# Patient Record
Sex: Female | Born: 1990 | Hispanic: Yes | Marital: Married | Smoking: Never smoker
Health system: Southern US, Community
[De-identification: ages and names within clinical notes are randomized; demographics above are authoritative.]

## PROBLEM LIST (undated history)

## (undated) ENCOUNTER — Inpatient Hospital Stay (HOSPITAL_COMMUNITY): Payer: Self-pay

## (undated) DIAGNOSIS — O021 Missed abortion: Secondary | ICD-10-CM

## (undated) DIAGNOSIS — I10 Essential (primary) hypertension: Secondary | ICD-10-CM

## (undated) DIAGNOSIS — O139 Gestational [pregnancy-induced] hypertension without significant proteinuria, unspecified trimester: Secondary | ICD-10-CM

## (undated) DIAGNOSIS — Z789 Other specified health status: Secondary | ICD-10-CM

## (undated) HISTORY — DX: Essential (primary) hypertension: I10

## (undated) HISTORY — PX: NO PAST SURGERIES: SHX2092

---

## 2014-11-21 ENCOUNTER — Inpatient Hospital Stay (HOSPITAL_COMMUNITY)
Admission: EM | Admit: 2014-11-21 | Discharge: 2014-11-21 | Disposition: A | Discharge status: Home / Self Care | Payer: Self-pay | Source: Ambulatory Visit | Attending: Family Medicine | Admitting: Family Medicine

## 2014-11-21 ENCOUNTER — Inpatient Hospital Stay (HOSPITAL_COMMUNITY): Payer: Self-pay

## 2014-11-21 ENCOUNTER — Encounter (HOSPITAL_COMMUNITY): Payer: Self-pay | Admitting: *Deleted

## 2014-11-21 DIAGNOSIS — B9689 Other specified bacterial agents as the cause of diseases classified elsewhere: Secondary | ICD-10-CM | POA: Insufficient documentation

## 2014-11-21 DIAGNOSIS — R109 Unspecified abdominal pain: Secondary | ICD-10-CM | POA: Insufficient documentation

## 2014-11-21 DIAGNOSIS — O4691 Antepartum hemorrhage, unspecified, first trimester: Secondary | ICD-10-CM

## 2014-11-21 DIAGNOSIS — O209 Hemorrhage in early pregnancy, unspecified: Secondary | ICD-10-CM | POA: Insufficient documentation

## 2014-11-21 DIAGNOSIS — N760 Acute vaginitis: Secondary | ICD-10-CM | POA: Insufficient documentation

## 2014-11-21 DIAGNOSIS — Z3A01 Less than 8 weeks gestation of pregnancy: Secondary | ICD-10-CM | POA: Insufficient documentation

## 2014-11-21 DIAGNOSIS — O360110 Maternal care for anti-D [Rh] antibodies, first trimester, not applicable or unspecified: Secondary | ICD-10-CM

## 2014-11-21 DIAGNOSIS — O23591 Infection of other part of genital tract in pregnancy, first trimester: Secondary | ICD-10-CM | POA: Insufficient documentation

## 2014-11-21 DIAGNOSIS — O26899 Other specified pregnancy related conditions, unspecified trimester: Secondary | ICD-10-CM

## 2014-11-21 DIAGNOSIS — O9989 Other specified diseases and conditions complicating pregnancy, childbirth and the puerperium: Secondary | ICD-10-CM | POA: Insufficient documentation

## 2014-11-21 LAB — URINALYSIS, ROUTINE W REFLEX MICROSCOPIC
Bilirubin Urine: NEGATIVE
Glucose, UA: NEGATIVE mg/dL
Ketones, ur: NEGATIVE mg/dL
LEUKOCYTES UA: NEGATIVE
Nitrite: NEGATIVE
PROTEIN: NEGATIVE mg/dL
Specific Gravity, Urine: 1.025 (ref 1.005–1.030)
Urobilinogen, UA: 0.2 mg/dL (ref 0.0–1.0)
pH: 6 (ref 5.0–8.0)

## 2014-11-21 LAB — ABO/RH: ABO/RH(D): O NEG

## 2014-11-21 LAB — CBC
HEMATOCRIT: 35.8 % — AB (ref 36.0–46.0)
Hemoglobin: 12.5 g/dL (ref 12.0–15.0)
MCH: 29.8 pg (ref 26.0–34.0)
MCHC: 34.9 g/dL (ref 30.0–36.0)
MCV: 85.4 fL (ref 78.0–100.0)
Platelets: 217 10*3/uL (ref 150–400)
RBC: 4.19 MIL/uL (ref 3.87–5.11)
RDW: 13 % (ref 11.5–15.5)
WBC: 10.8 10*3/uL — AB (ref 4.0–10.5)

## 2014-11-21 LAB — POCT PREGNANCY, URINE: Preg Test, Ur: POSITIVE — AB

## 2014-11-21 LAB — HCG, QUANTITATIVE, PREGNANCY: hCG, Beta Chain, Quant, S: 13254 m[IU]/mL — ABNORMAL HIGH (ref <5)

## 2014-11-21 LAB — WET PREP, GENITAL
Trich, Wet Prep: NONE SEEN
Yeast Wet Prep HPF POC: NONE SEEN

## 2014-11-21 LAB — URINE MICROSCOPIC-ADD ON

## 2014-11-21 MED ORDER — RHO D IMMUNE GLOBULIN 1500 UNIT/2ML IJ SOSY
300.0000 ug | PREFILLED_SYRINGE | Freq: Once | INTRAMUSCULAR | Status: AC
  Administered 2014-11-21: 300 ug via INTRAMUSCULAR
  Filled 2014-11-21: qty 2

## 2014-11-21 MED ORDER — METRONIDAZOLE 500 MG PO TABS: 500.0000 mg | ORAL_TABLET | Freq: Two times a day (BID) | ORAL | Status: DC

## 2014-11-21 MED ORDER — PRENATAL VITAMIN 27-0.8 MG PO TABS: 1.0000 | ORAL_TABLET | Freq: Every morning | ORAL | Status: DC

## 2014-11-21 NOTE — MAU Note (Signed)
3 pos HPT's last week, started bleeding this morning - less than a period, abd pain started last night.

## 2014-11-21 NOTE — MAU Provider Note (Signed)
History     CSN: [id number]  Arrival date and time: 11/21/14 1043   First Provider Initiated Contact with Patient 11/21/14 1210      Chief Complaint  Patient presents with  . Vaginal Bleeding  . Abdominal Pain   HPI  Jill Sexton 24 y.o. G1P0 @[redacted]w[redacted]d  presents to MAU complaining of vaginal bleeding and abdominal pain that started last evening.  No precipitating factors noted.  Pain feels like cramping in mid lower abdomen up to 5/10 at its worst.  No difficulty with nausea, vomiting, eating.  Bleeding is just a dark pink - more than spotting but less than bleeding.  Denies fever, weakness, headache, diarrhea, constipation, vaginal discharge.   OB History    Gravida Para Term Preterm AB TAB SAB Ectopic Multiple Living   1               History reviewed. No pertinent past medical history.  History reviewed. No pertinent past surgical history.  History reviewed. No pertinent family history.  History  Substance Use Topics  . Smoking status: Never Smoker   . Smokeless tobacco: Not on file  . Alcohol Use: No    Allergies: No Known Allergies  No prescriptions prior to admission    ROS Pertinent ROS in HPI.  All other systems are negative.   Physical Exam   Blood pressure 126/67, pulse 76, temperature 98.9 F (37.2 C), temperature source Oral, resp. rate 18, last menstrual period 10/13/2014.  Physical Exam  Constitutional: She is oriented to person, place, and time. She appears well-developed and well-nourished. No distress.  HENT:  Head: Normocephalic and atraumatic.  Eyes: EOM are normal.  Neck: Normal range of motion.  Cardiovascular: Normal rate and regular rhythm.   Respiratory: Effort normal and breath sounds normal. No respiratory distress.  GI: Soft. She exhibits no distension. There is tenderness.  Diffuse tenderness bilat lower abd  Genitourinary:  Small amt of dark pink frothy discharge noted.  No CMT.  NO adnexal mass or tenderness but uncomfortable with  exam in genera.    Musculoskeletal: Normal range of motion.  Neurological: She is alert and oriented to person, place, and time.  Skin: Skin is warm and dry.  Psychiatric: She has a normal mood and affect.   Results for orders placed or performed during the hospital encounter of 11/21/14 (from the past 24 hour(s))  Urinalysis, Routine w reflex microscopic (not at [facility])     Status: Abnormal   Collection Time: 11/21/14 11:00 AM  Result Value Ref Range   Color, Urine YELLOW YELLOW   APPearance CLEAR CLEAR   Specific Gravity, Urine 1.025 1.005 - 1.030   pH 6.0 5.0 - 8.0   Glucose, UA NEGATIVE NEGATIVE mg/dL   Hgb urine dipstick SMALL (A) NEGATIVE   Bilirubin Urine NEGATIVE NEGATIVE   Ketones, ur NEGATIVE NEGATIVE mg/dL   Protein, ur NEGATIVE NEGATIVE mg/dL   Urobilinogen, UA 0.2 0.0 - 1.0 mg/dL   Nitrite NEGATIVE NEGATIVE   Leukocytes, UA NEGATIVE NEGATIVE  Urine microscopic-add on     Status: Abnormal   Collection Time: 11/21/14 11:00 AM  Result Value Ref Range   Squamous Epithelial / LPF FEW (A) RARE   WBC, UA 0-2 <3 WBC/hpf  Pregnancy, urine POC     Status: Abnormal   Collection Time: 11/21/14 11:18 AM  Result Value Ref Range   Preg Test, Ur POSITIVE (A) NEGATIVE  CBC     Status: Abnormal   Collection Time: 11/21/14 12:25 PM  Result Value Ref Range   WBC 10.8 (H) 4.0 - 10.5 K/uL   RBC 4.19 3.87 - 5.11 MIL/uL   Hemoglobin 12.5 12.0 - 15.0 g/dL   HCT 35.8 (L) 36.0 - 46.0 %   MCV 85.4 78.0 - 100.0 fL   MCH 29.8 26.0 - 34.0 pg   MCHC 34.9 30.0 - 36.0 g/dL   RDW 13.0 11.5 - 15.5 %   Platelets 217 150 - 400 K/uL  ABO/Rh     Status: None (Preliminary result)   Collection Time: 11/21/14 12:25 PM  Result Value Ref Range   ABO/RH(D) O NEG   hCG, quantitative, pregnancy     Status: Abnormal   Collection Time: 11/21/14 12:25 PM  Result Value Ref Range   hCG, Beta Chain, Quant, S 13254 (H) <5 mIU/mL  Wet prep, genital     Status: Abnormal   Collection Time: 11/21/14  1:42 PM   Result Value Ref Range   Yeast Wet Prep HPF POC NONE SEEN NONE SEEN   Trich, Wet Prep NONE SEEN NONE SEEN   Clue Cells Wet Prep HPF POC MODERATE (A) NONE SEEN   WBC, Wet Prep HPF POC FEW (A) NONE SEEN   US Ob Comp Less 14 Wks  11/21/2014   CLINICAL DATA:  Pain beginning yesterday.  Bleeding today.  EXAM: OBSTETRIC <14 WK [place] AND TRANSVAGINAL OB US  TECHNIQUE: Both transabdominal and transvaginal ultrasound examinations were performed for complete evaluation of the gestation as well as the maternal uterus, adnexal regions, and pelvic cul-de-sac. Transvaginal technique was performed to assess early pregnancy.  COMPARISON:  None.  FINDINGS: Intrauterine gestational sac: Visualized/normal in shape.  Yolk sac:  Visualized  Embryo:  Not visualized  Cardiac Activity: Not visualized  Heart Rate:   bpm  MSD: 10  mm   5 w   5  d  CRL:    mm    w    d                  [place] EDC:  Maternal uterus/adnexae: No subchorionic hemorrhage. No adnexal masses. Small amount of free fluid in the pelvis.  IMPRESSION: Early intrauterine pregnancy. Yolk sac visualized. No fetal pole currently. Estimated gestational age [redacted] weeks 5 days by mean sac diameter.  No acute maternal findings.   Electronically Signed   By: Rolm Baptise M.D.   On: 11/21/2014 13:24   US Ob Transvaginal  11/21/2014   CLINICAL DATA:  Pain beginning yesterday.  Bleeding today.  EXAM: OBSTETRIC <14 WK [place] AND TRANSVAGINAL OB US  TECHNIQUE: Both transabdominal and transvaginal ultrasound examinations were performed for complete evaluation of the gestation as well as the maternal uterus, adnexal regions, and pelvic cul-de-sac. Transvaginal technique was performed to assess early pregnancy.  COMPARISON:  None.  FINDINGS: Intrauterine gestational sac: Visualized/normal in shape.  Yolk sac:  Visualized  Embryo:  Not visualized  Cardiac Activity: Not visualized  Heart Rate:   bpm  MSD: 10  mm   5 w   5  d  CRL:    mm    w    d                  [place] EDC:  Maternal  uterus/adnexae: No subchorionic hemorrhage. No adnexal masses. Small amount of free fluid in the pelvis.  IMPRESSION: Early intrauterine pregnancy. Yolk sac visualized. No fetal pole currently. Estimated gestational age [redacted] weeks 5 days by mean sac diameter.  No acute  maternal findings.   Electronically Signed   By: Rolm Baptise M.D.   On: 11/21/2014 13:24   MAU Course  Procedures  MDM Ectopic work up ordered.  Yolk sac present on U/S and therefore no concern for ectopic.  Wet prep consistent with BV O negative blood type therefore Rhogam workup and rhogam injection ordered.  Assessment and Plan  A: Vaginal bleeding and abdominal pain in pregnancy, BV, Rh negative  P: Discharge to home PNV qd Obtain [facility] asap Flagyl bid x 1 week NO etoh/IC x 10 days Patient may return to MAU as needed or if her condition were to change or worsen   Paticia Stack 11/21/2014, 12:25 PM

## 2014-11-21 NOTE — Discharge Instructions (Signed)

## 2014-11-22 LAB — RH IG WORKUP (INCLUDES ABO/RH)
ABO/RH(D): O NEG
Antibody Screen: NEGATIVE
Fetal Screen: NEGATIVE
Gestational Age(Wks): 5
Unit division: 0

## 2015-06-11 ENCOUNTER — Encounter (HOSPITAL_COMMUNITY): Payer: Self-pay

## 2015-06-11 ENCOUNTER — Inpatient Hospital Stay (HOSPITAL_COMMUNITY)
Admission: AD | Admit: 2015-06-11 | Discharge: 2015-06-11 | Disposition: A | Discharge status: Home / Self Care | Payer: Self-pay | Source: Ambulatory Visit | Attending: Obstetrics | Admitting: Obstetrics

## 2015-06-11 DIAGNOSIS — Z3A34 34 weeks gestation of pregnancy: Secondary | ICD-10-CM | POA: Insufficient documentation

## 2015-06-11 DIAGNOSIS — O133 Gestational [pregnancy-induced] hypertension without significant proteinuria, third trimester: Secondary | ICD-10-CM | POA: Insufficient documentation

## 2015-06-11 LAB — COMPREHENSIVE METABOLIC PANEL
ALT: 11 U/L — AB (ref 14–54)
ANION GAP: 11 (ref 5–15)
AST: 17 U/L (ref 15–41)
Albumin: 3.2 g/dL — ABNORMAL LOW (ref 3.5–5.0)
Alkaline Phosphatase: 166 U/L — ABNORMAL HIGH (ref 38–126)
BUN: 7 mg/dL (ref 6–20)
CHLORIDE: 105 mmol/L (ref 101–111)
CO2: 20 mmol/L — AB (ref 22–32)
CREATININE: 0.48 mg/dL (ref 0.44–1.00)
Calcium: 9 mg/dL (ref 8.9–10.3)
GFR calc non Af Amer: >60 mL/min (ref >60)
Glucose, Bld: 73 mg/dL (ref 65–99)
POTASSIUM: 3.9 mmol/L (ref 3.5–5.1)
SODIUM: 136 mmol/L (ref 135–145)
Total Bilirubin: 0.5 mg/dL (ref 0.3–1.2)
Total Protein: 6.4 g/dL — ABNORMAL LOW (ref 6.5–8.1)

## 2015-06-11 LAB — URINALYSIS, ROUTINE W REFLEX MICROSCOPIC
Bilirubin Urine: NEGATIVE
GLUCOSE, UA: NEGATIVE mg/dL
HGB URINE DIPSTICK: NEGATIVE
KETONES UR: NEGATIVE mg/dL
Nitrite: NEGATIVE
PROTEIN: NEGATIVE mg/dL
Specific Gravity, Urine: <1.005 — ABNORMAL LOW (ref 1.005–1.030)
pH: 5.5 (ref 5.0–8.0)

## 2015-06-11 LAB — URINE MICROSCOPIC-ADD ON: RBC / HPF: NONE SEEN RBC/hpf (ref 0–5)

## 2015-06-11 LAB — CBC
HCT: 35.6 % — ABNORMAL LOW (ref 36.0–46.0)
Hemoglobin: 12.2 g/dL (ref 12.0–15.0)
MCH: 29.2 pg (ref 26.0–34.0)
MCHC: 34.3 g/dL (ref 30.0–36.0)
MCV: 85.2 fL (ref 78.0–100.0)
Platelets: 196 10*3/uL (ref 150–400)
RBC: 4.18 MIL/uL (ref 3.87–5.11)
RDW: 13.5 % (ref 11.5–15.5)
WBC: 11.7 10*3/uL — AB (ref 4.0–10.5)

## 2015-06-11 LAB — PROTEIN / CREATININE RATIO, URINE
CREATININE, URINE: 11 mg/dL
Total Protein, Urine: <6 mg/dL

## 2015-06-11 LAB — URIC ACID: Uric Acid, Serum: 5 mg/dL (ref 2.3–6.6)

## 2015-06-11 LAB — LACTATE DEHYDROGENASE: LDH: 181 U/L (ref 98–192)

## 2015-06-11 NOTE — MAU Note (Signed)
Dr Ruthann Cancer sent her over, BP was high in the office today. (1st time).  Pt denies HA, visual changes, no epigastric pain, no increase in swelling

## 2015-06-11 NOTE — Discharge Instructions (Signed)
Hypertension During Pregnancy Hypertension is also called high blood pressure. Blood pressure moves blood in your body. Sometimes, the force that moves the blood becomes too strong. When you are pregnant, this condition should be watched carefully. It can cause problems for you and your baby. HOME CARE   Make and keep all of your doctor visits.  Take medicine as told by your doctor. Tell your doctor about all medicines you take.  Eat very little salt.  Exercise regularly.  Do not drink alcohol.  Do not smoke.  Do not have drinks with caffeine.  Lie on your left side when resting.  Your health care provider may ask you to take one low-dose aspirin (81mg ) each day. GET HELP RIGHT AWAY IF:  You have bad belly (abdominal) pain.  You have sudden puffiness (swelling) in the hands, ankles, or face.  You gain 4 pounds (1.8 kilograms) or more in 1 week.  You throw up (vomit) repeatedly.  You have bleeding from the vagina.  You do not feel the baby moving as much.  You have a headache.  You have blurred or double vision.  You have muscle twitching or spasms.  You have shortness of breath.  You have blue fingernails and lips.  You have blood in your pee (urine). MAKE SURE YOU:  Understand these instructions.  Will watch your condition.  Will get help right away if you are not doing well or get worse.   This information is not intended to replace advice given to you by your health care provider. Make sure you discuss any questions you have with your health care provider.   Document Released: 07/16/2010 Document Revised: 07/04/2014 Document Reviewed: 01/10/2013 Elsevier Interactive Patient Education 2016 [employer]  Preeclampsia and Eclampsia Preeclampsia is a serious condition that develops only during pregnancy. It is also called toxemia of pregnancy. This condition causes high blood pressure along with other symptoms, such as swelling and headaches. These may  develop as the condition gets worse. Preeclampsia may occur 20 weeks or later into your pregnancy.  Diagnosing and treating preeclampsia early is very important. If not treated early, it can cause serious problems for you and your baby. One problem it can lead to is eclampsia, which is a condition that causes muscle jerking or shaking (convulsions) in the mother. Delivering your baby is the best treatment for preeclampsia or eclampsia.  RISK FACTORS The cause of preeclampsia is not known. You may be more likely to develop preeclampsia if you have certain risk factors. These include:   Being pregnant for the first time.  Having preeclampsia in a past pregnancy.  Having a family history of preeclampsia.  Having high blood pressure.  Being pregnant with twins or triplets.  Being 97 or older.  Being African American.  Having kidney disease or diabetes.  Having medical conditions such as lupus or blood diseases.  Being very overweight (obese). SIGNS AND SYMPTOMS  The earliest signs of preeclampsia are:  High blood pressure.  Increased protein in your urine. Your health care provider will check for this at every prenatal visit. Other symptoms that can develop include:   Severe headaches.  Sudden weight gain.  Swelling of your hands, face, legs, and feet.  Feeling sick to your stomach (nauseous) and throwing up (vomiting).  Vision problems (blurred or double vision).  Numbness in your face, arms, legs, and feet.  Dizziness.  Slurred speech.  Sensitivity to bright lights.  Abdominal pain. DIAGNOSIS  There are no screening tests  for preeclampsia. Your health care provider will ask you about symptoms and check for signs of preeclampsia during your prenatal visits. You may also have tests, including:  Urine testing.  Blood testing.  Checking your baby's heart rate.  Checking the health of your baby and your placenta using images created with sound waves  (ultrasound). TREATMENT  You can work out the best treatment approach together with your health care provider. It is very important to keep all prenatal appointments. If you have an increased risk of preeclampsia, you may need more frequent prenatal exams.  Your health care provider may prescribe bed rest.  You may have to eat as little salt as possible.  You may need to take medicine to lower your blood pressure if the condition does not respond to more conservative measures.  You may need to stay in the hospital if your condition is severe. There, treatment will focus on controlling your blood pressure and fluid retention. You may also need to take medicine to prevent seizures.  If the condition gets worse, your baby may need to be delivered early to protect you and the baby. You may have your labor started with medicine (be induced), or you may have a cesarean delivery.  Preeclampsia usually goes away after the baby is born. HOME CARE INSTRUCTIONS   Only take over-the-counter or prescription medicines as directed by your health care provider.  Lie on your left side while resting. This keeps pressure off your baby.  Elevate your feet while resting.  Get regular exercise. Ask your health care provider what type of exercise is safe for you.  Avoid caffeine and alcohol.  Do not smoke.  Drink 6-8 glasses of water every day.  Eat a balanced diet that is low in salt. Do not add salt to your food.  Avoid stressful situations as much as possible.  Get plenty of rest and sleep.  Keep all prenatal appointments and tests as scheduled. SEEK MEDICAL CARE IF:  You are gaining more weight than expected.  You have any headaches, abdominal pain, or nausea.  You are bruising more than usual.  You feel dizzy or light-headed. SEEK IMMEDIATE MEDICAL CARE IF:   You develop sudden or severe swelling anywhere in your body. This usually happens in the legs.  You gain 5 lb (2.3 kg) or more  in a week.  You have a severe headache, dizziness, problems with your vision, or confusion.  You have severe abdominal pain.  You have lasting nausea or vomiting.  You have a seizure.  You have trouble moving any part of your body.  You develop numbness in your body.  You have trouble speaking.  You have any abnormal bleeding.  You develop a stiff neck.  You pass out. MAKE SURE YOU:   Understand these instructions.  Will watch your condition.  Will get help right away if you are not doing well or get worse.   This information is not intended to replace advice given to you by your health care provider. Make sure you discuss any questions you have with your health care provider.   Document Released: 06/10/2000 Document Revised: 06/18/2013 Document Reviewed: 04/05/2013 Elsevier Interactive Patient Education [employer].

## 2015-06-11 NOTE — MAU Note (Signed)
Sent Urine to the Lab

## 2015-06-11 NOTE — MAU Provider Note (Signed)
History     CSN: [id number]  Arrival date and time: 06/11/15 1408   First Provider Initiated Contact with Patient 06/11/15 1555      Chief Complaint  Patient presents with  . Hypertension   HPI   Ms.Jill Sexton is a 24 y.o. female G1P0 at 68w3dpresenting to MAU for further testing. The patient was seen in the office today at Dr. MMarcheta Grammesand had one elevated BP reading. She was sent here by her OB.    OB History    Gravida Para Term Preterm AB TAB SAB Ectopic Multiple Living   1               History reviewed. No pertinent past medical history.  History reviewed. No pertinent past surgical history.  History reviewed. No pertinent family history.  Social History  Substance Use Topics  . Smoking status: Never Smoker   . Smokeless tobacco: None  . Alcohol Use: No    Allergies: No Known Allergies  Prescriptions prior to admission  Medication Sig Dispense Refill Last Dose  . metroNIDAZOLE (FLAGYL) 500 MG tablet Take 1 tablet (500 mg total) by mouth 2 (two) times daily. 14 tablet 0   . Prenatal Vit-Fe Fumarate-FA (PRENATAL VITAMIN) 27-0.8 MG TABS Take 1 tablet by mouth every morning. 30 tablet 11    Results for orders placed or performed during the hospital encounter of 06/11/15 (from the past 48 hour(s))  Protein / creatinine ratio, urine     Status: None   Collection Time: 06/11/15  2:35 PM  Result Value Ref Range   Creatinine, Urine 11.00 mg/dL   Total Protein, Urine <6 mg/dL    Comment: NO NORMAL RANGE ESTABLISHED FOR THIS TEST REPEATED TO VERIFY    Protein Creatinine Ratio        0.00 - 0.15 mg/mg[Cre]    Comment: RESULT BELOW REPORTABLE RANGE, UNABLE TO CALCULATE.   [facility]     Status: Abnormal   Collection Time: 06/11/15  2:35 PM  Result Value Ref Range   Color, Urine YELLOW YELLOW   APPearance CLEAR CLEAR   Specific Gravity, Urine <1.005 (L) 1.005 - 1.030   pH 5.5 5.0 - 8.0   Glucose, UA NEGATIVE  NEGATIVE mg/dL   Hgb urine dipstick NEGATIVE NEGATIVE   Bilirubin Urine NEGATIVE NEGATIVE   Ketones, ur NEGATIVE NEGATIVE mg/dL   Protein, ur NEGATIVE NEGATIVE mg/dL   Nitrite NEGATIVE NEGATIVE   Leukocytes, UA SMALL (A) NEGATIVE  Urine microscopic-add on     Status: Abnormal   Collection Time: 06/11/15  2:35 PM  Result Value Ref Range   Squamous Epithelial / LPF 0-5 (A) NONE SEEN   WBC, UA 0-5 0 - 5 WBC/hpf   RBC / HPF NONE SEEN 0 - 5 RBC/hpf   Bacteria, UA RARE (A) NONE SEEN  CBC     Status: Abnormal   Collection Time: 06/11/15  3:39 PM  Result Value Ref Range   WBC 11.7 (H) 4.0 - 10.5 K/uL   RBC 4.18 3.87 - 5.11 MIL/uL   Hemoglobin 12.2 12.0 - 15.0 g/dL   HCT 35.6 (L) 36.0 - 46.0 %   MCV 85.2 78.0 - 100.0 fL   MCH 29.2 26.0 - 34.0 pg   MCHC 34.3 30.0 - 36.0 g/dL   RDW 13.5 11.5 - 15.5 %   Platelets 196 150 - 400 K/uL  Comprehensive metabolic panel     Status: Abnormal   Collection Time:  06/11/15  3:39 PM  Result Value Ref Range   Sodium 136 135 - 145 mmol/L   Potassium 3.9 3.5 - 5.1 mmol/L   Chloride 105 101 - 111 mmol/L   CO2 20 (L) 22 - 32 mmol/L   Glucose, Bld 73 65 - 99 mg/dL   BUN 7 6 - 20 mg/dL   Creatinine, Ser 0.48 0.44 - 1.00 mg/dL   Calcium 9.0 8.9 - 10.3 mg/dL   Total Protein 6.4 (L) 6.5 - 8.1 g/dL   Albumin 3.2 (L) 3.5 - 5.0 g/dL   AST 17 15 - 41 U/L   ALT 11 (L) 14 - 54 U/L   Alkaline Phosphatase 166 (H) 38 - 126 U/L   Total Bilirubin 0.5 0.3 - 1.2 mg/dL   GFR calc non Af Amer >60 >60 mL/min   GFR calc Af Amer >60 >60 mL/min    Comment: (NOTE) The eGFR has been calculated using the CKD EPI equation. This calculation has not been validated in all clinical situations. eGFR's persistently <60 mL/min signify possible Chronic Kidney Disease.    Anion gap 11 5 - 15  Uric acid     Status: None   Collection Time: 06/11/15  3:39 PM  Result Value Ref Range   Uric Acid, Serum 5.0 2.3 - 6.6 mg/dL  Lactate dehydrogenase     Status: None   Collection Time:  06/11/15  3:39 PM  Result Value Ref Range   LDH 181 98 - 192 U/L    Review of Systems  Eyes: Negative for blurred vision.  Cardiovascular: Negative for leg swelling.  Gastrointestinal: Negative for abdominal pain.  Neurological: Negative for headaches.   Physical Exam   Blood pressure 129/81, pulse 91, temperature 98.5 F (36.9 C), temperature source Oral, resp. rate 18, weight 173 lb 9.6 oz (78.744 kg), last menstrual period 10/13/2014.    Patient Vitals for the past 24 hrs:  BP Temp Temp src Pulse Resp Weight  06/11/15 1545 129/81 mmHg - - 91 - -  06/11/15 1530 127/86 mmHg - - 90 - -  06/11/15 1515 129/92 mmHg - - 82 - -  06/11/15 1508 126/87 mmHg - - 77 - -  06/11/15 1427 147/97 mmHg 98.5 F (36.9 C) Oral 82 18 173 lb 9.6 oz (78.744 kg)    Physical Exam  Constitutional: She appears well-developed and well-nourished. No distress.  HENT:  Head: Normocephalic.  Eyes: Pupils are equal, round, and reactive to light.  Neck: Neck supple.  Respiratory: Effort normal.  GI: Soft.  Musculoskeletal: Normal range of motion.  Neurological: She has normal reflexes. She displays normal reflexes.  Negative clonus   Skin: Skin is warm. She is not diaphoretic.  Psychiatric: Her behavior is normal.    Fetal Tracing: Baseline:  130 bpm  Variability: Moderate  Accelerations: 15x15 Decelerations: None Toco: quiet   MAU Course  Procedures  None  MDM  Her next appointment in the office is Dec 22nd.  Discussed labs>HPI> and BP readings with Dr. Ruthann Cancer @1625   Assessment and Plan   A:  1. Gestational hypertension, third trimester    P:  Discharge home in stable condition Strict preeclampsia precautions  Follow up with Dr. Ruthann Cancer as scheduled Kick counts    Lezlie Lye, NP 06/11/2015 4:16 PM

## 2015-06-14 LAB — OB RESULTS CONSOLE HEPATITIS B SURFACE ANTIGEN: Hepatitis B Surface Ag: NEGATIVE

## 2015-06-14 LAB — OB RESULTS CONSOLE ABO/RH: RH TYPE: NEGATIVE

## 2015-06-14 LAB — OB RESULTS CONSOLE RPR: RPR: NONREACTIVE

## 2015-06-14 LAB — OB RESULTS CONSOLE HIV ANTIBODY (ROUTINE TESTING): HIV: NONREACTIVE

## 2015-06-14 LAB — OB RESULTS CONSOLE GBS: STREP GROUP B AG: NEGATIVE

## 2015-06-14 LAB — OB RESULTS CONSOLE GC/CHLAMYDIA
Chlamydia: NEGATIVE
Gonorrhea: NEGATIVE

## 2015-06-14 LAB — OB RESULTS CONSOLE RUBELLA ANTIBODY, IGM: RUBELLA: IMMUNE

## 2015-06-18 ENCOUNTER — Encounter (HOSPITAL_COMMUNITY): Payer: Self-pay | Admitting: *Deleted

## 2015-06-18 ENCOUNTER — Inpatient Hospital Stay (HOSPITAL_COMMUNITY)
Admission: AD | Admit: 2015-06-18 | Discharge: 2015-06-18 | Disposition: A | Discharge status: Home / Self Care | Payer: Self-pay | Source: Ambulatory Visit | Attending: Obstetrics | Admitting: Obstetrics

## 2015-06-18 DIAGNOSIS — O133 Gestational [pregnancy-induced] hypertension without significant proteinuria, third trimester: Secondary | ICD-10-CM

## 2015-06-18 DIAGNOSIS — Z3A35 35 weeks gestation of pregnancy: Secondary | ICD-10-CM | POA: Insufficient documentation

## 2015-06-18 DIAGNOSIS — O163 Unspecified maternal hypertension, third trimester: Secondary | ICD-10-CM | POA: Insufficient documentation

## 2015-06-18 DIAGNOSIS — O479 False labor, unspecified: Secondary | ICD-10-CM

## 2015-06-18 DIAGNOSIS — O4703 False labor before 37 completed weeks of gestation, third trimester: Secondary | ICD-10-CM

## 2015-06-18 HISTORY — DX: Other specified health status: Z78.9

## 2015-06-18 LAB — CBC WITH DIFFERENTIAL/PLATELET
BASOS PCT: 0 %
Basophils Absolute: 0 10*3/uL (ref 0.0–0.1)
Eosinophils Absolute: 0.1 10*3/uL (ref 0.0–0.7)
Eosinophils Relative: 1 %
HEMATOCRIT: 34.5 % — AB (ref 36.0–46.0)
HEMOGLOBIN: 12.1 g/dL (ref 12.0–15.0)
Lymphocytes Relative: 28 %
Lymphs Abs: 2.6 10*3/uL (ref 0.7–4.0)
MCH: 29.7 pg (ref 26.0–34.0)
MCHC: 35.1 g/dL (ref 30.0–36.0)
MCV: 84.6 fL (ref 78.0–100.0)
MONOS PCT: 4 %
Monocytes Absolute: 0.4 10*3/uL (ref 0.1–1.0)
NEUTROS ABS: 6.4 10*3/uL (ref 1.7–7.7)
NEUTROS PCT: 67 %
Platelets: 194 10*3/uL (ref 150–400)
RBC: 4.08 MIL/uL (ref 3.87–5.11)
RDW: 13.5 % (ref 11.5–15.5)
WBC: 9.5 10*3/uL (ref 4.0–10.5)

## 2015-06-18 LAB — COMPREHENSIVE METABOLIC PANEL
ALT: 13 U/L — ABNORMAL LOW (ref 14–54)
AST: 17 U/L (ref 15–41)
Albumin: 3.4 g/dL — ABNORMAL LOW (ref 3.5–5.0)
Alkaline Phosphatase: 171 U/L — ABNORMAL HIGH (ref 38–126)
Anion gap: 11 (ref 5–15)
BUN: 7 mg/dL (ref 6–20)
CO2: 20 mmol/L — ABNORMAL LOW (ref 22–32)
Calcium: 8.9 mg/dL (ref 8.9–10.3)
Chloride: 105 mmol/L (ref 101–111)
Creatinine, Ser: 0.47 mg/dL (ref 0.44–1.00)
GFR calc Af Amer: >60 mL/min (ref >60)
GFR calc non Af Amer: >60 mL/min (ref >60)
Glucose, Bld: 80 mg/dL (ref 65–99)
Potassium: 3.8 mmol/L (ref 3.5–5.1)
Sodium: 136 mmol/L (ref 135–145)
Total Bilirubin: 0.4 mg/dL (ref 0.3–1.2)
Total Protein: 7.1 g/dL (ref 6.5–8.1)

## 2015-06-18 LAB — LACTATE DEHYDROGENASE: LDH: 184 U/L (ref 98–192)

## 2015-06-18 LAB — URINALYSIS, ROUTINE W REFLEX MICROSCOPIC
Bilirubin Urine: NEGATIVE
GLUCOSE, UA: NEGATIVE mg/dL
Hgb urine dipstick: NEGATIVE
Ketones, ur: NEGATIVE mg/dL
LEUKOCYTES UA: NEGATIVE
Nitrite: NEGATIVE
PH: 6 (ref 5.0–8.0)
Protein, ur: NEGATIVE mg/dL
Specific Gravity, Urine: <1.005 — ABNORMAL LOW (ref 1.005–1.030)

## 2015-06-18 LAB — PROTEIN / CREATININE RATIO, URINE: CREATININE, URINE: 15 mg/dL

## 2015-06-18 LAB — URIC ACID: URIC ACID, SERUM: 4.6 mg/dL (ref 2.3–6.6)

## 2015-06-18 NOTE — MAU Provider Note (Signed)
  History     CSN: [id number]  Arrival date and time: 06/18/15 1348   First Provider Initiated Contact with Patient 06/18/15 1507      Chief Complaint  Patient presents with  . PIH eval    HPI Jill Sexton 24 y.o. G1P0 @[redacted]w[redacted]d  presents to MAU after found to have elevated BP in clinic today.  She notes good fetal movement and denies vaginal bleeding, LOF, HA, vision change, epigastric pain.   OB History    Gravida Para Term Preterm AB TAB SAB Ectopic Multiple Living   1               Past Medical History  Diagnosis Date  . Medical history non-contributory     Past Surgical History  Procedure Laterality Date  . No past surgeries      History reviewed. No pertinent family history.  Social History  Substance Use Topics  . Smoking status: Never Smoker   . Smokeless tobacco: None  . Alcohol Use: No    Allergies: No Known Allergies  Prescriptions prior to admission  Medication Sig Dispense Refill Last Dose  . metroNIDAZOLE (FLAGYL) 500 MG tablet Take 1 tablet (500 mg total) by mouth 2 (two) times daily. (Patient not taking: Reported on 06/11/2015) 14 tablet 0 Completed Course at Unknown time  . Prenatal Vit-Fe Fumarate-FA (PRENATAL VITAMIN) 27-0.8 MG TABS Take 1 tablet by mouth every morning. (Patient not taking: Reported on 06/18/2015) 30 tablet 11 06/11/2015 at Unknown time    ROS Pertinent ROS in HPI.  All other systems are negative.   Physical Exam   Blood pressure 133/86, pulse 76, temperature 98.2 F (36.8 C), resp. rate 18, last menstrual period 10/13/2014.  Physical Exam  Constitutional: She is oriented to person, place, and time. She appears well-developed and well-nourished. No distress.  HENT:  Head: Normocephalic and atraumatic.  Eyes: Conjunctivae and EOM are normal.  Neck: Neck supple.  Respiratory: Effort normal. No respiratory distress.  Genitourinary:  Cervical check: soft, closed  Musculoskeletal: Normal range of motion. She exhibits no edema.   Neurological: She is alert and oriented to person, place, and time.  Skin: Skin is warm and dry.  Psychiatric: She has a normal mood and affect. Her behavior is normal.   Fetal Tracing: Baseline:135 Variability:mod Accelerations: 15x15s Decelerations:none Toco:q 6 minutes - pt not feeling contractions   MAU Course  Procedures  MDM PIH labs ordered.  All labs normal.  Dr. Ruthann Cancer notified of labs, contractions, fetal strip.  He advises for discharge to home with monitoring for contractions.    Assessment and Plan  A:  1. Elevated blood pressure affecting pregnancy in third trimester, antepartum   2. Braxton Hicks contractions    P: Discharge to home PTL precautions Keep OB appts with Dr. Ruthann Cancer Patient may return to MAU as needed or if her condition were to change or worsen   Paticia Stack 06/18/2015, 3:10 PM

## 2015-06-18 NOTE — Discharge Instructions (Signed)
Hypertension During Pregnancy Hypertension is also called high blood pressure. Blood pressure moves blood in your body. Sometimes, the force that moves the blood becomes too strong. When you are pregnant, this condition should be watched carefully. It can cause problems for you and your baby. HOME CARE   Make and keep all of your doctor visits.  Take medicine as told by your doctor. Tell your doctor about all medicines you take.  Eat very little salt.  Exercise regularly.  Do not drink alcohol.  Do not smoke.  Do not have drinks with caffeine.  Lie on your left side when resting.  Your health care provider may ask you to take one low-dose aspirin (81mg ) each day. GET HELP RIGHT AWAY IF:  You have bad belly (abdominal) pain.  You have sudden puffiness (swelling) in the hands, ankles, or face.  You gain 4 pounds (1.8 kilograms) or more in 1 week.  You throw up (vomit) repeatedly.  You have bleeding from the vagina.  You do not feel the baby moving as much.  You have a headache.  You have blurred or double vision.  You have muscle twitching or spasms.  You have shortness of breath.  You have blue fingernails and lips.  You have blood in your pee (urine). MAKE SURE YOU:  Understand these instructions.  Will watch your condition.  Will get help right away if you are not doing well or get worse.   This information is not intended to replace advice given to you by your health care provider. Make sure you discuss any questions you have with your health care provider.   Document Released: 07/16/2010 Document Revised: 07/04/2014 Document Reviewed: 01/10/2013 Elsevier Interactive Patient Education 2016 [employer]. Informacin sobre el parto prematuro  (Preterm Labor Information) El parto prematuro comienza antes de la semana 50 de [place]. La duracin de un embarazo normal es de 39 a 41 semanas.  CAUSAS  Generalmente no hay una causa que pueda identificarse del  motivo por el que una mujer comienza un trabajo de parto prematuro. Sin embargo, una de las causas conocidas ms frecuentes son las infecciones. Las infecciones del tero, el cuello, la vagina, el lquido Germania, la vejiga, los riones y Nurse, children's de los pulmones (neumona) pueden hacer que el trabajo de parto se inicie. Otras causas que pueden sospecharse son:   Infecciones urogenitales, como infecciones por hongos y vaginosis bacteriana.   Anormalidades uterinas (forma del tero, sptum uterino, fibromas, hemorragias en la placenta).   Un cuello que ha sido operado (puede ser que no permanezca cerrado).   Malformaciones del feto.   Gestaciones mltiples (mellizos, trillizos y ms).   [place] previa de parto prematuro.   Tener ruptura prematura de las membranas (RPM).   La placenta cubre la abertura del cuello (placenta previa).   La placenta se separa del tero (abrupcin placentaria).   El cuello es demasiado dbil para contener al beb en el tero (cuello incompetente).   Hay mucho lquido en el saco amnitico (polihidramnios).   Consumo de drogas o hbito de fumar durante Water quality scientist.   No aumentar de peso lo suficiente durante el [employer].   Mujeres menores de 18 aos o mayores de 35 aos.   Nivel socioeconmico bajo.   Pertenecer a Advertising copywriter. SNTOMAS  Los signos y sntomas del trabajo de parto prematuro son:   Wellsite geologist similares a los Agricultural engineer, dolor abdominal o dolor de espalda.  Contracciones uterinas regulares, tan frecuentes como seis por hora, sin importar su intensidad (pueden ser suaves o dolorosas).  Contracciones que comienzan en la parte superior del tero y se expanden hacia abajo, a la zona inferior del abdomen y la espalda.   Sensacin de aumento de presin en la pelvis.   Aparece una secrecin acuosa o sanguinolenta por la vagina.  TRATAMIENTO  Segn el tiempo del  embarazo y otras [place], el mdico puede indicar reposo en cama. Si es necesario, le indicarn medicamentos para Scientist, water quality las contracciones y para Neurosurgeon los pulmones del feto. Si el trabajo de parto se inicia antes de las 34 semanas de [place], se recomienda la hospitalizacin. El tratamiento depende de las condiciones en que se encuentren usted y el feto.  QU DEBE HACER SI PIENSA QUE EST EN TRABAJO DE PARTO PREMATURO?  Comunquese con su mdico inmediatamente. Debe concurrir al hospital para ser controlada inmediatamente.  [place] EN FUTUROS EMBARAZOS?  Usted debe:   Si fuma, abandonar el hbito.  Mantener un peso saludable y evitar sustancias qumicas y drogas innecesarias.  Controlar todo tipo de infeccin.  Informe a su mdico si tiene una historia conocida de trabajo de parto prematuro.   Esta informacin no tiene Marine scientist el consejo del mdico. Asegrese de hacerle al mdico cualquier pregunta que tenga.   Document Released: 09/20/2007 Document Revised: 02/13/2013 Elsevier Interactive Patient Education [employer].

## 2015-06-18 NOTE — MAU Note (Signed)
Pt sent to MAU from Dr Marcheta Grammes office for [facility] evaluation. BP was increased in the office. Denies any headache or blurred vision

## 2015-06-18 NOTE — MAU Note (Signed)
Urine in lab 

## 2015-06-28 NOTE — L&D Delivery Note (Signed)
Delivery Note At 12:42 AM a viable female was delivered via Vaginal, Spontaneous Delivery (Presentation: ; Occiput Anterior).  APGAR: , ; weight  .   Placenta status: Intact, Spontaneous.  Cord:  with the following complications: None.  Cord pH: not done  Anesthesia:   Episiotomy: Median Lacerations:   Suture Repair: 2.0 vicryl Est. Blood Loss (mL):    Mom to postpartum.  Baby to Couplet care / Skin to Skin.  MARSHALL,BERNARD A 07/17/2015, 1:01 AM

## 2015-07-16 ENCOUNTER — Inpatient Hospital Stay (HOSPITAL_COMMUNITY)
Admission: AD | Admit: 2015-07-16 | Discharge: 2015-07-20 | DRG: 775 | Disposition: A | Discharge status: Home / Self Care | Payer: Medicaid Other | Source: Ambulatory Visit | Attending: Obstetrics | Admitting: Obstetrics

## 2015-07-16 ENCOUNTER — Encounter (HOSPITAL_COMMUNITY): Payer: Self-pay | Admitting: *Deleted

## 2015-07-16 DIAGNOSIS — Z3A39 39 weeks gestation of pregnancy: Secondary | ICD-10-CM | POA: Diagnosis not present

## 2015-07-16 DIAGNOSIS — O1494 Unspecified pre-eclampsia, complicating childbirth: Secondary | ICD-10-CM | POA: Diagnosis present

## 2015-07-16 DIAGNOSIS — R030 Elevated blood-pressure reading, without diagnosis of hypertension: Secondary | ICD-10-CM | POA: Diagnosis present

## 2015-07-16 DIAGNOSIS — O169 Unspecified maternal hypertension, unspecified trimester: Secondary | ICD-10-CM | POA: Diagnosis present

## 2015-07-16 LAB — COMPREHENSIVE METABOLIC PANEL
ALBUMIN: 3.1 g/dL — AB (ref 3.5–5.0)
ALT: 14 U/L (ref 14–54)
ANION GAP: 11 (ref 5–15)
AST: 22 U/L (ref 15–41)
Alkaline Phosphatase: 268 U/L — ABNORMAL HIGH (ref 38–126)
BUN: 9 mg/dL (ref 6–20)
CALCIUM: 9.1 mg/dL (ref 8.9–10.3)
CO2: 21 mmol/L — AB (ref 22–32)
Chloride: 104 mmol/L (ref 101–111)
Creatinine, Ser: 0.53 mg/dL (ref 0.44–1.00)
GFR calc non Af Amer: >60 mL/min (ref >60)
GLUCOSE: 99 mg/dL (ref 65–99)
POTASSIUM: 3.9 mmol/L (ref 3.5–5.1)
SODIUM: 136 mmol/L (ref 135–145)
TOTAL PROTEIN: 6.4 g/dL — AB (ref 6.5–8.1)
Total Bilirubin: 0.4 mg/dL (ref 0.3–1.2)

## 2015-07-16 LAB — TYPE AND SCREEN
ABO/RH(D): O NEG
Antibody Screen: NEGATIVE

## 2015-07-16 LAB — PROTEIN / CREATININE RATIO, URINE
CREATININE, URINE: 25 mg/dL
PROTEIN CREATININE RATIO: 1.72 mg/mg{creat} — AB (ref 0.00–0.15)
TOTAL PROTEIN, URINE: 43 mg/dL

## 2015-07-16 LAB — CBC
HEMATOCRIT: 36.3 % (ref 36.0–46.0)
HEMOGLOBIN: 12.8 g/dL (ref 12.0–15.0)
MCH: 29.8 pg (ref 26.0–34.0)
MCHC: 35.3 g/dL (ref 30.0–36.0)
MCV: 84.4 fL (ref 78.0–100.0)
Platelets: 156 10*3/uL (ref 150–400)
RBC: 4.3 MIL/uL (ref 3.87–5.11)
RDW: 14.1 % (ref 11.5–15.5)
WBC: 10.6 10*3/uL — AB (ref 4.0–10.5)

## 2015-07-16 MED ORDER — FENTANYL 2.5 MCG/ML BUPIVACAINE 1/10 % EPIDURAL INFUSION (WH - ANES): 14.0000 mL/h | INTRAMUSCULAR | Status: DC | PRN

## 2015-07-16 MED ORDER — TERBUTALINE SULFATE 1 MG/ML IJ SOLN: 0.2500 mg | Freq: Once | INTRAMUSCULAR | Status: DC | PRN

## 2015-07-16 MED ORDER — BUTORPHANOL TARTRATE 1 MG/ML IJ SOLN
1.0000 mg | INTRAMUSCULAR | Status: DC | PRN
  Administered 2015-07-16: 1 mg via INTRAVENOUS
  Filled 2015-07-16: qty 1

## 2015-07-16 MED ORDER — OXYCODONE-ACETAMINOPHEN 5-325 MG PO TABS: 2.0000 | ORAL_TABLET | ORAL | Status: DC | PRN

## 2015-07-16 MED ORDER — LIDOCAINE HCL (PF) 1 % IJ SOLN
30.0000 mL | INTRAMUSCULAR | Status: DC | PRN
  Administered 2015-07-17: 30 mL via SUBCUTANEOUS
  Filled 2015-07-16: qty 30

## 2015-07-16 MED ORDER — LACTATED RINGERS IV SOLN
INTRAVENOUS | Status: DC
  Administered 2015-07-16: 23:00:00 via INTRAVENOUS
  Administered 2015-07-16: 100 mL/h via INTRAVENOUS

## 2015-07-16 MED ORDER — LABETALOL HCL 5 MG/ML IV SOLN
INTRAVENOUS | Status: AC
  Filled 2015-07-16: qty 4

## 2015-07-16 MED ORDER — CITRIC ACID-SODIUM CITRATE 334-500 MG/5ML PO SOLN: 30.0000 mL | ORAL | Status: DC | PRN

## 2015-07-16 MED ORDER — OXYTOCIN 10 UNIT/ML IJ SOLN
2.5000 [IU]/h | INTRAVENOUS | Status: DC
  Administered 2015-07-17 (×2): 2.5 [IU]/h via INTRAVENOUS

## 2015-07-16 MED ORDER — MAGNESIUM SULFATE 50 % IJ SOLN
2.0000 g/h | INTRAVENOUS | Status: DC
  Administered 2015-07-17: 2 g/h via INTRAVENOUS
  Filled 2015-07-16 (×2): qty 80

## 2015-07-16 MED ORDER — ONDANSETRON HCL 4 MG/2ML IJ SOLN: 4.0000 mg | Freq: Four times a day (QID) | INTRAMUSCULAR | Status: DC | PRN

## 2015-07-16 MED ORDER — OXYTOCIN 10 UNIT/ML IJ SOLN
1.0000 m[IU]/min | INTRAMUSCULAR | Status: DC
  Administered 2015-07-16: 2 m[IU]/min via INTRAVENOUS
  Filled 2015-07-16: qty 4

## 2015-07-16 MED ORDER — OXYCODONE-ACETAMINOPHEN 5-325 MG PO TABS
1.0000 | ORAL_TABLET | ORAL | Status: DC | PRN
  Administered 2015-07-17: 1 via ORAL
  Filled 2015-07-16: qty 1

## 2015-07-16 MED ORDER — LABETALOL HCL 5 MG/ML IV SOLN
20.0000 mg | INTRAVENOUS | Status: AC | PRN
  Administered 2015-07-16: 40 mg via INTRAVENOUS
  Administered 2015-07-16: 20 mg via INTRAVENOUS
  Administered 2015-07-17: 40 mg via INTRAVENOUS
  Filled 2015-07-16 (×2): qty 8

## 2015-07-16 MED ORDER — HYDRALAZINE HCL 20 MG/ML IJ SOLN: 10.0000 mg | Freq: Once | INTRAMUSCULAR | Status: DC | PRN

## 2015-07-16 MED ORDER — MAGNESIUM SULFATE BOLUS VIA INFUSION
4.0000 g | Freq: Once | INTRAVENOUS | Status: AC
  Administered 2015-07-16: 4 g via INTRAVENOUS
  Filled 2015-07-16: qty 500

## 2015-07-16 MED ORDER — LACTATED RINGERS IV SOLN: 500.0000 mL | INTRAVENOUS | Status: DC | PRN

## 2015-07-16 MED ORDER — EPHEDRINE 5 MG/ML INJ: 10.0000 mg | INTRAVENOUS | Status: DC | PRN

## 2015-07-16 MED ORDER — ACETAMINOPHEN 325 MG PO TABS
650.0000 mg | ORAL_TABLET | ORAL | Status: DC | PRN
  Administered 2015-07-16: 650 mg via ORAL
  Filled 2015-07-16: qty 2

## 2015-07-16 MED ORDER — DIPHENHYDRAMINE HCL 50 MG/ML IJ SOLN: 12.5000 mg | INTRAMUSCULAR | Status: DC | PRN

## 2015-07-16 MED ORDER — OXYTOCIN BOLUS FROM INFUSION
500.0000 mL | INTRAVENOUS | Status: DC
  Administered 2015-07-17: 500 mL via INTRAVENOUS

## 2015-07-16 MED ORDER — FLEET ENEMA 7-19 GM/118ML RE ENEM
1.0000 | ENEMA | RECTAL | Status: DC | PRN
Start: 2015-07-16 — End: 2015-07-17

## 2015-07-16 MED ORDER — PHENYLEPHRINE 40 MCG/ML (10ML) SYRINGE FOR IV PUSH (FOR BLOOD PRESSURE SUPPORT): 80.0000 ug | PREFILLED_SYRINGE | INTRAVENOUS | Status: DC | PRN

## 2015-07-16 NOTE — Progress Notes (Signed)
Report received from Riki Sheer, RN

## 2015-07-16 NOTE — H&P (Signed)
This is Dr. Gracy Racer dictating the history and physical on  terre lesar  she's a 25 year old gravida 1 at 12 weeks and 3 days EDC 07/20/2015 negative GBS for the past 3 weeks whenever she is seen her blood pressures are elevated and she sent to MAU and labs are all normal and her blood pressures came back normal however today in the office blood pressure 1 6100 with 3+ protein and since she's 39 weeks and plus days it was decided to bring her in for induction she was started on magnesium sulfate 4 g loading 2 g an hour and since admission her blood pressures have been fine she is 2 cm 90% vertex 0 station amniotomy performed thin meconium-stained fluid she is also on low-dose Pitocin her labs are normal Past medical history negative Past surgical history negative Social history negative System review negative Physical exam well-developed female not in labor HEENT negative Lungs clear to P&A Heart regular rhythm no murmurs no gallops Breasts negative Abdomen term Pelvic as described above Extremities negative

## 2015-07-17 ENCOUNTER — Encounter (HOSPITAL_COMMUNITY): Payer: Self-pay

## 2015-07-17 LAB — CBC
HEMATOCRIT: 30.2 % — AB (ref 36.0–46.0)
Hemoglobin: 10.3 g/dL — ABNORMAL LOW (ref 12.0–15.0)
MCH: 28.9 pg (ref 26.0–34.0)
MCHC: 34.1 g/dL (ref 30.0–36.0)
MCV: 84.6 fL (ref 78.0–100.0)
PLATELETS: 144 10*3/uL — AB (ref 150–400)
RBC: 3.57 MIL/uL — ABNORMAL LOW (ref 3.87–5.11)
RDW: 14.2 % (ref 11.5–15.5)
WBC: 18.7 10*3/uL — AB (ref 4.0–10.5)

## 2015-07-17 LAB — RPR: RPR: NONREACTIVE

## 2015-07-17 MED ORDER — TETANUS-DIPHTH-ACELL PERTUSSIS 5-2.5-18.5 LF-MCG/0.5 IM SUSP
0.5000 mL | Freq: Once | INTRAMUSCULAR | Status: DC
  Filled 2015-07-17: qty 0.5

## 2015-07-17 MED ORDER — LANOLIN HYDROUS EX OINT: TOPICAL_OINTMENT | CUTANEOUS | Status: DC | PRN

## 2015-07-17 MED ORDER — LACTATED RINGERS IV SOLN
INTRAVENOUS | Status: DC
  Administered 2015-07-17 – 2015-07-18 (×2): via INTRAVENOUS

## 2015-07-17 MED ORDER — DIPHENHYDRAMINE HCL 25 MG PO CAPS: 25.0000 mg | ORAL_CAPSULE | Freq: Four times a day (QID) | ORAL | Status: DC | PRN

## 2015-07-17 MED ORDER — ACETAMINOPHEN 325 MG PO TABS: 650.0000 mg | ORAL_TABLET | ORAL | Status: DC | PRN

## 2015-07-17 MED ORDER — ONDANSETRON HCL 4 MG PO TABS: 4.0000 mg | ORAL_TABLET | ORAL | Status: DC | PRN

## 2015-07-17 MED ORDER — SENNOSIDES-DOCUSATE SODIUM 8.6-50 MG PO TABS
2.0000 | ORAL_TABLET | ORAL | Status: DC
  Administered 2015-07-18 – 2015-07-19 (×3): 2 via ORAL
  Filled 2015-07-17 (×3): qty 2

## 2015-07-17 MED ORDER — ZOLPIDEM TARTRATE 5 MG PO TABS: 5.0000 mg | ORAL_TABLET | Freq: Every evening | ORAL | Status: DC | PRN

## 2015-07-17 MED ORDER — IBUPROFEN 600 MG PO TABS
600.0000 mg | ORAL_TABLET | Freq: Four times a day (QID) | ORAL | Status: DC
  Administered 2015-07-17 – 2015-07-20 (×14): 600 mg via ORAL
  Filled 2015-07-17 (×14): qty 1

## 2015-07-17 MED ORDER — DIBUCAINE 1 % RE OINT
1.0000 | TOPICAL_OINTMENT | RECTAL | Status: DC | PRN
Start: 2015-07-17 — End: 2015-07-20
  Filled 2015-07-17: qty 28

## 2015-07-17 MED ORDER — BENZOCAINE-MENTHOL 20-0.5 % EX AERO
1.0000 "application " | INHALATION_SPRAY | CUTANEOUS | Status: DC | PRN
  Filled 2015-07-17: qty 56

## 2015-07-17 MED ORDER — WITCH HAZEL-GLYCERIN EX PADS: 1.0000 "application " | MEDICATED_PAD | CUTANEOUS | Status: DC | PRN

## 2015-07-17 MED ORDER — PRENATAL MULTIVITAMIN CH
1.0000 | ORAL_TABLET | Freq: Every day | ORAL | Status: DC
Start: 2015-07-17 — End: 2015-07-20
  Administered 2015-07-17 – 2015-07-20 (×4): 1 via ORAL
  Filled 2015-07-17 (×4): qty 1

## 2015-07-17 MED ORDER — FERROUS SULFATE 325 (65 FE) MG PO TABS
325.0000 mg | ORAL_TABLET | Freq: Two times a day (BID) | ORAL | Status: DC
  Administered 2015-07-17 – 2015-07-20 (×7): 325 mg via ORAL
  Filled 2015-07-17 (×10): qty 1

## 2015-07-17 MED ORDER — OXYTOCIN 10 UNIT/ML IJ SOLN
INTRAVENOUS | Status: AC
  Administered 2015-07-17: 2.5 [IU]/h via INTRAVENOUS
  Filled 2015-07-17: qty 4

## 2015-07-17 MED ORDER — ONDANSETRON HCL 4 MG/2ML IJ SOLN: 4.0000 mg | INTRAMUSCULAR | Status: DC | PRN

## 2015-07-17 MED ORDER — SIMETHICONE 80 MG PO CHEW
80.0000 mg | CHEWABLE_TABLET | ORAL | Status: DC | PRN
  Administered 2015-07-17 (×2): 80 mg via ORAL
  Filled 2015-07-17 (×2): qty 1

## 2015-07-17 NOTE — Lactation Note (Signed)
This note was copied from the chart of Jill Jinan [place]. Lactation Consultation Note  Patient Name: Jill Sexton [id number] Date: 07/17/2015 Reason for consult: Initial assessment Baby at 53 hr of life and has not been feeding well per RN. Baby is sleepy and reluctant to suck. While LC was discussing DEBP, [place] handouts, and latching the baby spit up. The family was so worried about the brownish red emesis that they stopped listening. They requested to see the "RN right away", nurse was notified. Given handouts. She is aware of OP services and support group. She may need more bf education.    Maternal Data Has patient been taught Hand Expression?: Yes  Feeding Feeding Type: Breast Fed Length of feed: 1 min (few sucks)  LATCH Score/Interventions Latch: Repeated attempts needed to sustain latch, nipple held in mouth throughout feeding, stimulation needed to elicit sucking reflex. Intervention(s): Adjust position;Assist with latch;Breast massage;Breast compression  Audible Swallowing: None Intervention(s): Skin to skin;Hand expression Intervention(s): Skin to skin;Hand expression  Type of Nipple: Everted at rest and after stimulation  Comfort (Breast/Nipple): Soft / non-tender     Hold (Positioning): Assistance needed to correctly position infant at breast and maintain latch. Intervention(s): Breastfeeding basics reviewed;Support Pillows;Position options;Skin to skin  LATCH Score: 6  Lactation Tools Discussed/Used Tools: Pump Breast pump type: Double-Electric Breast Pump   Consult Status Consult Status: Follow-up Date: 07/18/15 Follow-up type: In-patient    Denzil Hughes 07/17/2015, 7:23 PM

## 2015-07-18 MED ORDER — INFLUENZA VAC SPLIT QUAD 0.5 ML IM SUSY
0.5000 mL | PREFILLED_SYRINGE | INTRAMUSCULAR | Status: DC
Start: 2015-07-19 — End: 2015-07-18

## 2015-07-18 MED ORDER — RHO D IMMUNE GLOBULIN 1500 UNIT/2ML IJ SOSY
300.0000 ug | PREFILLED_SYRINGE | Freq: Once | INTRAMUSCULAR | Status: AC
  Administered 2015-07-18: 300 ug via INTRAVENOUS
  Filled 2015-07-18: qty 2

## 2015-07-18 MED ORDER — INFLUENZA VAC SPLIT QUAD 0.5 ML IM SUSY
0.5000 mL | PREFILLED_SYRINGE | Freq: Once | INTRAMUSCULAR | Status: AC
  Administered 2015-07-18: 0.5 mL via INTRAMUSCULAR

## 2015-07-18 MED ORDER — RHO D IMMUNE GLOBULIN 1500 UNIT/2ML IJ SOSY
300.0000 ug | PREFILLED_SYRINGE | Freq: Once | INTRAMUSCULAR | Status: DC
  Filled 2015-07-18: qty 2

## 2015-07-18 NOTE — Progress Notes (Signed)
Assisted RN with interpretation of patient assessment.   °Spanish Interpreter °

## 2015-07-18 NOTE — Progress Notes (Signed)
Checked on patients needs.  °Spanish Interpreter  °

## 2015-07-18 NOTE — Progress Notes (Signed)
Checked on patients needs. Assisted RN with baby assessment.  Also ordered patients meals and snack. Spanish interpreter

## 2015-07-18 NOTE — Lactation Note (Signed)
This note was copied from the chart of Jill Sexton. Lactation Consultation Note  Patient Name: Jill Sexton [id number] Date: 07/18/2015 Reason for consult: Follow-up assessment;Infant < 6lbs   Follow up with mom in Antenatal. Spoke with mom using [place] interpreter [facility] # [phone]. Infant with 8 BF for 10-30 minutes, 4 attempts, supplementation of EBM 5 cc x 2, 4 voids and 1 stool in last 24 hours. Infant was cueing to feed. She latched easily to right breast in football hold and nursed rhythmically for 8 minutes with intermittent swallows. Mom reports breast feel fuller, she says infant is nursing better today. She did have some nipple excoriation on left breast yesterday that she says is better today. Pain is noted with initial latch but improves with feeding. Enc mom to feed 8-12 x in 24 hours at first feeding cues, awakening at 3 hours to feed and stimulating throughout feeding to maintain suckling. Feedings should be followed by pumping for 15 minutes using DEBP for 15 minutes on Initiate setting followed by hand expression. All EBM should be given to infant via cup feeding. Mom voiced understanding to all teaching. Left room with mom pumping. Report given to Sharyn Lull, RN in nursery.   Maternal Data Formula Feeding for Exclusion: No Has patient been taught Hand Expression?: Yes Does the patient have breastfeeding experience prior to this delivery?: No  Feeding Feeding Type: Breast Fed Length of feed: 8 min  LATCH Score/Interventions Latch: Grasps breast easily, tongue down, lips flanged, rhythmical sucking. Intervention(s): Assist with latch;Breast massage;Breast compression  Audible Swallowing: Spontaneous and intermittent Intervention(s): Skin to skin;Hand expression Intervention(s): Alternate breast massage;Hand expression  Type of Nipple: Everted at rest and after stimulation  Comfort (Breast/Nipple): Filling, red/small blisters or bruises, mild/mod  discomfort  Problem noted: Cracked, bleeding, blisters, bruises Interventions  (Cracked/bleeding/bruising/blister): Expressed breast milk to nipple;Double electric pump Interventions (Mild/moderate discomfort):  (hand expressed breastmilk to nipples)  Hold (Positioning): Assistance needed to correctly position infant at breast and maintain latch. Intervention(s): Breastfeeding basics reviewed;Support Pillows;Position options;Skin to skin  LATCH Score: 8  Lactation Tools Discussed/Used Tools: Feeding cup WIC Program: No Pump Review: Setup, frequency, and cleaning;Milk Storage   Consult Status Consult Status: Follow-up Date: 07/19/15 Follow-up type: In-patient    Debby Freiberg Platon Arocho 07/18/2015, 5:39 PM

## 2015-07-19 LAB — RH IG WORKUP (INCLUDES ABO/RH)
ABO/RH(D): O NEG
Fetal Screen: NEGATIVE
GESTATIONAL AGE(WKS): 39.4
UNIT DIVISION: 0

## 2015-07-19 MED ORDER — LABETALOL HCL 100 MG PO TABS
200.0000 mg | ORAL_TABLET | Freq: Three times a day (TID) | ORAL | Status: DC
  Administered 2015-07-19 – 2015-07-20 (×4): 200 mg via ORAL
  Filled 2015-07-19 (×4): qty 2

## 2015-07-19 MED ORDER — AMLODIPINE BESYLATE 5 MG PO TABS
5.0000 mg | ORAL_TABLET | Freq: Every day | ORAL | Status: DC
  Administered 2015-07-19 – 2015-07-20 (×2): 5 mg via ORAL
  Filled 2015-07-19 (×2): qty 1

## 2015-07-19 NOTE — Progress Notes (Deleted)
Assisted with interpretation with registration information.  Spanish Interpreter

## 2015-07-19 NOTE — Progress Notes (Deleted)
Assisted MD with interpretation of discharge instructions.  Spanish Interpreter

## 2015-07-19 NOTE — Progress Notes (Signed)
Post Partum Day 2 Subjective: no complaints  Objective: Blood pressure 147/93, pulse 100, temperature 97.5 F (36.4 C), temperature source Oral, resp. rate 16, height 5\' 3"  (1.6 m), weight 180 lb (81.647 kg), last menstrual period 10/13/2014, SpO2 98 %, unknown if currently breastfeeding.  Physical Exam:  General: alert and no distress Lochia: appropriate Uterine Fundus: firm Incision: none DVT Evaluation: No evidence of DVT seen on physical exam.   Recent Labs  07/16/15 1430 07/17/15 0530  HGB 12.8 10.3*  HCT 36.3 30.2*    Assessment/Plan: BP still unstable.  Labetalol / Norvasc Rx Plan for discharge tomorrow   LOS: 3 days   Nnaemeka Samson A 07/19/2015, 6:09 AM

## 2015-07-19 NOTE — Progress Notes (Signed)
Assisted RN with interpretation of baby assessment.  Spanish Interpreter

## 2015-07-19 NOTE — Progress Notes (Signed)
Checked on patients needs. Ordered patients dinner and breakfast. Spanish Interpreter

## 2015-07-19 NOTE — Lactation Note (Addendum)
This note was copied from the chart of Jill Everlie Voelz. Lactation Consultation Note  Interpreter present. Baby < 6 lbs. 3.1% weight loss. Observed feeding in football hold.  Sucks and some swallows observed. Explained the parents the importance of post pumping &/or hand expressing after feedings and giving volume back to baby. Encouraged mother to post pump approx 6 times a day.  Mother states she understands. Demonstrated how to massage breast during feeding.   Spoke with RN to remind mother supplement after feedings with her breast milk.   Patient Name: Jill Sexton [id number] Date: 07/19/2015 Reason for consult: Follow-up assessment   Maternal Data    Feeding Feeding Type: Breast Fed Length of feed: 25 min  LATCH Score/Interventions Latch: Grasps breast easily, tongue down, lips flanged, rhythmical sucking. Intervention(s): Breast massage  Audible Swallowing: A few with stimulation  Type of Nipple: Everted at rest and after stimulation  Comfort (Breast/Nipple): Filling, red/small blisters or bruises, mild/mod discomfort  Interventions  (Cracked/bleeding/bruising/blister): Expressed breast milk to nipple  Hold (Positioning): No assistance needed to correctly position infant at breast.  LATCH Score: 8  Lactation Tools Discussed/Used     Consult Status Consult Status: Follow-up Date: 07/20/15 Follow-up type: In-patient    Vivianne Master [facility] 07/19/2015, 4:09 PM

## 2015-07-19 NOTE — Progress Notes (Deleted)
Assisted RN with interpretation of triage questions.  °Spanish Interpreter  °

## 2015-07-19 NOTE — Progress Notes (Signed)
Assisted MD with interpretation of pt assessment.  Spanish Interepreter

## 2015-07-20 NOTE — Progress Notes (Signed)
Patient did not want to order lunch. [place]  Interpreter.

## 2015-07-20 NOTE — Progress Notes (Signed)
Spanish interpreter used (device used)

## 2015-07-20 NOTE — Lactation Note (Signed)
This note was copied from the chart of Jill Jenevieve Clinesmith. Lactation Consultation Note  Follow up done prior to discharge with interpreter.  Mom states baby is latching but she is supplementing with small amounts of formula and expressed breast milk. She feels the baby is not getting enough because the milk is not in.  Instructed to stop formula when breasts fill if breastfeeding going well.  Rented symphony pump for 2 weeks to continue post pumping.  Patient Name: Jill Sexton [id number] Date: 07/20/2015     Maternal Data    Feeding Feeding Type: Breast Fed Length of feed: 20 min  LATCH Score/Interventions Latch:  (enc mother to call for next [facility])                    Lactation Tools Discussed/Used     Consult Status      Ave Filter 07/20/2015, 11:49 AM

## 2015-07-20 NOTE — Discharge Summary (Signed)
Obstetric Discharge Summary Reason for Admission: induction of labor Prenatal Procedures: Preeclampsia Intrapartum Procedures: spontaneous vaginal delivery Postpartum Procedures: none Complications-Operative and Postpartum: none HEMOGLOBIN  Date Value Ref Range Status  07/17/2015 10.3* 12.0 - 15.0 g/dL Final   HCT  Date Value Ref Range Status  07/17/2015 30.2* 36.0 - 46.0 % Final    Physical Exam:  General: alert Lochia: appropriate Uterine Fundus: firm Incision: healing well DVT Evaluation: No evidence of DVT seen on physical exam.  Discharge Diagnoses: Term Pregnancy-delivered  Discharge Information: Date: 07/20/2015 Activity: pelvic rest Diet: routine Medications: Percocet Condition: stable Instructions: refer to practice specific booklet Discharge to: home Follow-up Information    Follow up with Nakeshia Waldeck A, MD In 1 week.   Specialty:  Obstetrics and Gynecology   Contact information:   [address] [place] [address] [phone]       Newborn Data: Live born female  Birth Weight: 5 lb 14.2 oz (2670 g) APGAR: 8, 9  Home with mother.  Garin Mata A 07/20/2015, 6:22 AM

## 2015-07-20 NOTE — Progress Notes (Signed)
Patient ID: Jill Sexton, female   DOB: [birth year], 25 y.o.   MRN: [id number] Postpartum day 3 Blood pressure 139/87 pulse 98 respiration 16 She is on Norvasc 5 mg by mouth daily and labetalol 200  Three  times a day Fundus firm Lochia moderate Legs negative Home today to see me in one week for blood pressure check

## 2015-07-20 NOTE — Discharge Instructions (Signed)
Discharge instructions   You can wash your hair  Shower  Eat what you want  Drink what you want  See me in 6 weeks  Your ankles are going to swell more in the next 2 weeks than when pregnant  No sex for 6 weeks   MARSHALL,BERNARD A, MD 07/20/2015  Hipertensin posparto (Postpartum Hypertension) La hipertensin posparto es cuando la presin arterial se mantiene ms alta que lo normal durante ms de [employer] del [place]. Puede no darse cuenta de que tiene hipertensin posparto si no le miden la presin arterial con regularidad. En algunos casos, la hipertensin posparto desaparece sola, por lo general en la semana posterior al parto. Sin embargo, algunas mujeres requieren tratamiento mdico para prevenir complicaciones graves, como convulsiones o un ictus. Entre las cosas que pueden influir en la presin arterial, se incluye lo siguiente: El tipo de [place]. La administracin de lquidos u otros medicamentos por va intravenosa durante o despus del parto. CAUSAS  La hipertensin posparto puede ser consecuencia de cualquiera de los siguientes factores o de la combinacin de cualquiera de ellos: Hipertensin que exista antes del embarazo (hipertensin crnica). Hipertensin gestacional. Preeclampsia o eclampsia. Administracin de mucho lquido a travs de una va intravenosa durante o despus del parto. Medicamentos. Sndrome de HELLP Hipertiroidismo. Ictus. Otros trastornos neurolgicos o sanguneos poco frecuentes. En algunos casos, es posible que la causa no se conozca. [place] hipertensin posparto puede relacionarse con uno o ms factores de riesgo, por ejemplo: Hipertensin crnica. En algunos casos, es posible que esta no se haya diagnosticado antes del embarazo. Obesidad. Diabetes tipo 2. Enfermedad renal. Antecedentes familiares de preeclampsia. Otras enfermedades que causan desequilibrios hormonales. SIGNOS Y SNTOMAS Al igual que con todos los  tipos de hipertensin, la hipertensin posparto puede no causar ningn sntoma. Segn lo alta que est la presin arterial, puede presentar lo siguiente: Dolores de [place]. Estos pueden ser leves, moderados o intensos. Tambin pueden ser regulares, constantes o de inicio repentino (cefalea en estallido). Cambios en la visin. Mareos. Falta de aire. Hinchazn de [employer], los pies, la parte inferior de las piernas o la cara. En algunos casos, puede tener hinchazn en varias de estas reas. Palpitaciones o latidos cardacos acelerados. Dificultad para respirar al [place]. Disminucin de la cantidad [place]. Otros signos y sntomas poco frecuentes pueden incluir lo siguiente: Ms sudoracin que la habitual. Esta dura ms que [employer] del parto. Dolor en el pecho. Mareos repentinos al levantarse despus de haber estado sentada o [place]. Convulsiones. Nuseas o vmitos. Dolor abdominal. DIAGNSTICO El diagnstico de hipertensin posparto se establece mediante la combinacin de los [place] de los exmenes fsicos y los anlisis de sangre y [place]. Tambin pueden realizarle [employer], como una tomografa computarizada o una resonancia magntica, para Hydrographic surveyor otras complicaciones de la hipertensin posparto. TRATAMIENTO Cuando la presin arterial est lo suficientemente alta como para requerir tratamiento, las opciones incluyen lo siguiente: Medicamentos para disminuir la presin arterial (antihipertensivos). [place] al mdico si est amamantando o si planifica hacerlo. Hay muchos medicamentos antihipertensivos que pueden tomarse sin riesgos durante la Transport planner. Interrupcin de los [employer] puedan causar la hipertensin. Tratamiento de las enfermedades que causan la hipertensin. Tratamiento de las complicaciones de la hipertensin, como convulsiones, ictus o problemas renales. El mdico seguir supervisando atentamente y de forma repetida su presin arterial  hasta que esta se encuentre en un nivel seguro para usted.  [place] los medicamentos solamente  como se lo haya indicado el mdico. Haga actividad fsica con regularidad despus de que el mdico le diga que es seguro [place]. Siga las recomendaciones de su mdico sobre los lmites en el consumo de sal y lquido. No consuma ningn producto que contenga tabaco, lo que incluye cigarrillos, tabaco de Higher education careers adviser o Psychologist, sport and exercise. Si necesita ayuda para dejar de fumar, consulte al mdico. Concurra a todas las visitas de control como se lo haya indicado el mdico. Esto es importante. SOLICITE ATENCIN MDICA SI: Los sntomas empeoran. Aparecen nuevos sntomas, por ejemplo: Dolor de cabeza. Mareos. Cambios en la visin. SOLICITE ATENCIN MDICA DE INMEDIATO SI: Tiene un dolor de cabeza intenso o repentino. Tiene convulsiones. Siente debilidad o adormecimiento en un lado del cuerpo. Tiene dificultades para pensar, hablar o tragar. Sufre un dolor abdominal intenso. Tiene dificultad para respirar, dolor en el pecho, latidos cardacos acelerados o palpitaciones. Estos sntomas pueden representar un problema grave que constituye Engineer, maintenance (IT). No espere hasta que los sntomas desaparezcan. Solicite atencin mdica de inmediato. Comunquese con el servicio de emergencias de su localidad (911 en los [place]). No conduzca por sus propios medios Principal Financial.   Esta informacin no tiene Marine scientist el consejo del mdico. Asegrese de hacerle al mdico cualquier pregunta que tenga.   Document Released: 03/28/2014 Elsevier Interactive Patient Education [employer].

## 2015-07-20 NOTE — Progress Notes (Signed)
States understanding, signed and given copy. Specific Instruction forms given in [place].

## 2017-03-28 ENCOUNTER — Encounter (HOSPITAL_COMMUNITY): Payer: Self-pay | Admitting: Emergency Medicine

## 2017-03-28 ENCOUNTER — Other Ambulatory Visit: Payer: Self-pay

## 2017-03-28 ENCOUNTER — Emergency Department (HOSPITAL_COMMUNITY)
Admission: EM | Admit: 2017-03-28 | Discharge: 2017-03-29 | Disposition: A | Discharge status: Home / Self Care | Payer: Self-pay | Attending: Emergency Medicine | Admitting: Emergency Medicine

## 2017-03-28 DIAGNOSIS — N644 Mastodynia: Secondary | ICD-10-CM | POA: Insufficient documentation

## 2017-03-28 HISTORY — DX: Gestational (pregnancy-induced) hypertension without significant proteinuria, unspecified trimester: O13.9

## 2017-03-28 MED ORDER — KETOROLAC TROMETHAMINE 60 MG/2ML IM SOLN
30.0000 mg | Freq: Once | INTRAMUSCULAR | Status: AC
  Administered 2017-03-28: 30 mg via INTRAMUSCULAR
  Filled 2017-03-28: qty 2

## 2017-03-28 NOTE — ED Triage Notes (Signed)
Pt reports bilateral breast pain X few days. Pt also already been to see PCP. Tender with palpation over both breast, no tenderness over nipples, denies DC from nipples.

## 2017-03-28 NOTE — ED Provider Notes (Signed)
[facility] DEPT Provider Note   CSN: [id number] Arrival date & time: 03/28/17  2145     History   Chief Complaint Chief Complaint  Patient presents with  . Breast Pain    HPI Jill Sexton is a 26 y.o. female who presents today accompanied by friend who is serving as a Optometrist. Patient is complaining of acute onset, constant bilateral breast pain since yesterday. Patient states that pain is severe and aching, worsens with palpation and movement of the arms. She denies trauma or falls. She has had pain like this in the past and was evaluated by women's clinic. She was told that she likely had fibrocystic breast disease. She states that her pain is intermittent, primarily occurs surrounding her period. Last menstrual cycle ended on Sunday and symptoms began the day after. She denies excessive caffeine intake and is a nonsmoker. She has not tried anything for her symptoms. Denies fevers, shortness of breath, or chest pain otherwise. She denies nipple pain or nipple discharge. Denies any redness or rashes to the area. She has no family history of breast cancer. The history is provided by the patient.    Past Medical History:  Diagnosis Date  . Gestational hypertension   . Medical history non-contributory     Patient Active Problem List   Diagnosis Date Noted  . NVD (normal vaginal delivery) 07/17/2015  . Hypertension affecting pregnancy 07/16/2015    Past Surgical History:  Procedure Laterality Date  . NO PAST SURGERIES      OB History    Gravida Para Term Preterm AB Living   1 1 1  0 0 1   SAB TAB Ectopic Multiple Live Births   0 0 0 0 1       Home Medications    Prior to Admission medications   Not on File    Family History No family history on file.  Social History Social History  Substance Use Topics  . Smoking status: Never Smoker  . Smokeless tobacco: Not on file  . Alcohol use No     Allergies   Patient has no known allergies.   Review of  Systems Review of Systems  Constitutional: Negative for chills and fever.  Respiratory: Negative for shortness of breath.   Cardiovascular: Positive for chest pain (bilateral breast pain).  Gastrointestinal: Negative for abdominal pain, nausea and vomiting.  Genitourinary: Negative for dyspareunia, menstrual problem, pelvic pain, vaginal bleeding, vaginal discharge and vaginal pain.  Skin: Negative for color change.     Physical Exam Updated Vital Signs BP (!) 168/101 (BP Location: Right Arm)   Pulse 68   Temp 98.5 F (36.9 C) (Oral)   Resp 20   Ht 5\' 3"  (1.6 m)   Wt 81.6 kg (180 lb)   SpO2 98%   BMI 31.89 kg/m   Physical Exam  Constitutional: She appears well-developed and well-nourished. No distress.  HENT:  Head: Normocephalic and atraumatic.  Eyes: Conjunctivae are normal. Right eye exhibits no discharge. Left eye exhibits no discharge.  Neck: Normal range of motion. Neck supple. No JVD present. No tracheal deviation present.  Cardiovascular: Normal rate, regular rhythm and normal heart sounds.   Pulmonary/Chest: Effort normal and breath sounds normal. No respiratory distress. She has no wheezes. She has no rales. She exhibits no edema, no swelling and no retraction. Right breast exhibits tenderness. Right breast exhibits no inverted nipple, no mass, no nipple discharge and no skin change. Left breast exhibits tenderness. Left breast exhibits no inverted nipple,  no mass, no nipple discharge and no skin change. Breasts are symmetrical. There is no breast swelling.  Examination performed in the presence of a chaperone. Diffuse tenderness to palpation of the bilateral breasts, maximally in the upper outer quadrant of both breasts. The breasts have a lumpy consistency, but no solitary masses are identified. No erythema, fluctuance, or induration noted. The nipples are normal in appearance and nontender. No drainage noted. No skin puckering or other skin changes noted. Palpation of the  chest wall and ribs is unremarkable. Equal rise and fall of chest, no increased work of breathing, no paradoxical wall motion. Pain worsens with upward motion of the bilateral arms.  Abdominal: Soft. Bowel sounds are normal. There is no tenderness.  Genitourinary: There is breast tenderness. No breast discharge or bleeding.  Musculoskeletal: She exhibits no edema.  Neurological: She is alert.  Skin: Skin is warm and dry. Capillary refill takes less than 2 seconds. No erythema.  Psychiatric: She has a normal mood and affect. Her behavior is normal.  Nursing note and vitals reviewed.    ED Treatments / Results  Labs (all labs ordered are listed, but only abnormal results are displayed) Labs Reviewed - No data to display  EKG  EKG Interpretation None       Radiology No results found.  Procedures Procedures (including critical care time)  Medications Ordered in ED Medications  ketorolac (TORADOL) injection 30 mg (30 mg Intramuscular Given 03/28/17 2349)     Initial Impression / Assessment and Plan / ED Course  I have reviewed the triage vital signs and the nursing notes.  Pertinent labs & imaging results that were available during my care of the patient were reviewed by me and considered in my medical decision making (see chart for details).     Patient with bilateral breast pain for the past 2 days. This is a recurrent problem, typically occurs around or after her menstrual cycle. Afebrile, somewhat hypertensive while in the ED. Maximally tender to palpation in the upper outer quadrants of the breasts. No evidence of mastitis, hidradenitis suppurativa, or solitary breast mass. 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 Low suspicion of neoplasm, or cardiopulmonary etiology of pain. Very low suspicion of ACS or MI. Had an extensive discussion with the patient regarding  treatment with anti-inflammatories and Tylenol, lifestyle modifications, and follow-up with primary care OB/GYN and possible ultrasound of the breasts. Discussed taking her blood pressure at home, and if it remains elevated she should follow-up with primary care physician for possible medication management. Discussed indications for return to the ED. Pain managed while in the ED. Patient and patient's friend who is translating verbalized understanding of and agreement with plan and patient is stable for discharge home at this time.  Final Clinical Impressions(s) / ED Diagnoses   Final diagnoses:  Breast pain    New Prescriptions There are no discharge medications for this patient.    Renita Papa, PA-C 03/29/17 0103    Daleen Bo, MD 03/29/17 1326

## 2017-03-28 NOTE — Discharge Instructions (Signed)
Alternate 600 mg of ibuprofen and [phone] mg of Tylenol every 3 hours as needed for pain. Do not exceed 4000 mg of Tylenol daily. Apply ice or heat to the affected area for comfort, which ever feels best. Take some hot baths or showers  and do some gentle stretching during this time. Avoid caffeine and smoking. Follow-up with your OB/GYN for reevaluation. You may need an ultrasound for further evaluation of your breast pain. Return to the emergency department if any concerning signs or symptoms develop such as fevers, redness, swelling, or abnormal drainage from the breasts.

## 2017-03-29 ENCOUNTER — Other Ambulatory Visit: Payer: Self-pay | Admitting: Family Medicine

## 2017-03-29 ENCOUNTER — Ambulatory Visit (INDEPENDENT_AMBULATORY_CARE_PROVIDER_SITE_OTHER): Payer: Self-pay | Admitting: Family Medicine

## 2017-03-29 VITALS — BP 130/79 | HR 81 | Wt 150.3 lb

## 2017-03-29 DIAGNOSIS — N644 Mastodynia: Secondary | ICD-10-CM | POA: Insufficient documentation

## 2017-03-29 MED ORDER — CYCLOBENZAPRINE HCL 10 MG PO TABS: 10.0000 mg | ORAL_TABLET | Freq: Three times a day (TID) | ORAL | 1 refills | Status: DC | PRN

## 2017-03-29 NOTE — Progress Notes (Signed)
  PCP: Patient, No Pcp Per  Chief Complaint:  Chief Complaint  Patient presents with  . Breast Pain    History of Present Illness: Patient is a 26 y.o. G1P1001 presents for annual exam. Patient complains of bilateral breast pain that started 2 days ago. Had similar pain 1 month ago. Pain is worse around her periods and when she lifts her arms above her head. She says she can feel lumps in both breasts. Took tylenol and this helped. Pain radiates to bilateral shoulders.   LMP: Patient's last menstrual period was 03/20/2017 (exact date).   Review of Systems: Review of Systems  Constitutional: Negative for chills and fever.  Respiratory: Negative for cough and hemoptysis.   Cardiovascular: Negative for chest pain and palpitations.  Musculoskeletal:        Pos Shoulder pain, pos bilateral breast pain, lumps in breasts    Past Medical History:  Past Medical History:  Diagnosis Date  . Gestational hypertension   . Medical history non-contributory     Past Surgical History:  Past Surgical History:  Procedure Laterality Date  . NO PAST SURGERIES      Obstetric History: G1P1001  Family History:  No family history on file.  Social History:  Social History   Social History  . Marital status: Married    Spouse name: N/A  . Number of children: N/A  . Years of education: N/A   Occupational History  . Not on file.   Social History Main Topics  . Smoking status: Never Smoker  . Smokeless tobacco: Not on file  . Alcohol use No  . Drug use: No  . Sexual activity: Yes    Birth control/ protection: None   Other Topics Concern  . Not on file   Social History Narrative  . No narrative on file    Allergies:  No Known Allergies  Medications: Prior to Admission medications   Medication Sig Start Date End Date Taking? Authorizing Provider  acetaminophen (TYLENOL) 500 MG tablet Take 500 mg by mouth every 6 (six) hours as needed.   Yes [provider]  ibuprofen  (ADVIL,MOTRIN) 600 MG tablet Take 600 mg by mouth every 6 (six) hours as needed.   Yes [provider]    Physical Exam Vitals: Blood pressure 130/79, pulse 81, weight 150 lb 4.8 oz (68.2 kg), last menstrual period 03/20/2017  General: NAD HEENT: normocephalic, anicteric Thyroid: no enlargement, no palpable nodules Breast: Breast symmetrical, tenderness to palpation in bilateral axilla, no palpable nodules or masses, no skin or nipple retraction present, no nipple discharge.  No axillary or supraclavicular lymphadenopathy. Abdomen: NABS, soft, non-tender, non-distended.  Umbilicus without lesions.  No hepatomegaly, splenomegaly or masses palpable. No evidence of hernia  Extremities: no edema, erythema, or tenderness Neurologic: Grossly intact Psychiatric: mood appropriate, affect full MSK: pain with bilateral arm abduction and external rotation  Female chaperone present for pelvic and breast  portions of the physical exam   Assessment: 26 y.o. G1P1001 here for bilateral breast pain that based on physical exam is more musculoskeletal. No lumps in bilateral breasts, and shoulders are also tender with abduction and external rotation.  Plan: Prescribe flexeril and continue tylenol. Add ibuprofen for pain. Patient is very concerned and requests breast US- ordered bilateral breast US. RTC in 4 weeks if no improvement.

## 2017-03-29 NOTE — Progress Notes (Signed)
Su Ley translator for patient

## 2017-03-29 NOTE — Patient Instructions (Signed)
Chest Wall Pain °Chest wall pain is pain in or around the bones and muscles of your chest. Sometimes, an injury causes this pain. Sometimes, the cause may not be known. This pain may take several weeks or longer to get better. °Follow these instructions at home: °Pay attention to any changes in your symptoms. Take these actions to help with your pain: °· Rest as told by your doctor. °· Avoid activities that cause pain. Try not to use your chest, belly (abdominal), or side muscles to lift heavy things. °· If directed, apply ice to the painful area: °? Put ice in a plastic bag. °? Place a towel between your skin and the bag. °? Leave the ice on for 20 minutes, 2-3 times per day. °· Take over-the-counter and prescription medicines only as told by your doctor. °· Do not use tobacco products, including cigarettes, chewing tobacco, and e-cigarettes. If you need help quitting, ask your doctor. °· Keep all follow-up visits as told by your doctor. This is important. ° °Contact a doctor if: °· You have a fever. °· Your chest pain gets worse. °· You have new symptoms. °Get help right away if: °· You feel sick to your stomach (nauseous) or you throw up (vomit). °· You feel sweaty or light-headed. °· You have a cough with phlegm (sputum) or you cough up blood. °· You are short of breath. °This information is not intended to replace advice given to you by your health care provider. Make sure you discuss any questions you have with your health care provider. °Document Released: 11/30/2007 Document Revised: 11/19/2015 Document Reviewed: 09/08/2014 °Elsevier Interactive Patient Education © 2018 [employer] ° °

## 2017-04-03 ENCOUNTER — Other Ambulatory Visit (HOSPITAL_COMMUNITY): Payer: Self-pay | Admitting: *Deleted

## 2017-04-03 DIAGNOSIS — N63 Unspecified lump in unspecified breast: Secondary | ICD-10-CM

## 2017-04-03 DIAGNOSIS — N644 Mastodynia: Secondary | ICD-10-CM

## 2017-04-20 ENCOUNTER — Ambulatory Visit (HOSPITAL_COMMUNITY)
Admission: RE | Admit: 2017-04-20 | Discharge: 2017-04-20 | Disposition: A | Discharge status: Home / Self Care | Payer: Self-pay | Source: Ambulatory Visit | Attending: Obstetrics and Gynecology | Admitting: Obstetrics and Gynecology

## 2017-04-20 ENCOUNTER — Encounter (HOSPITAL_COMMUNITY): Payer: Self-pay | Admitting: *Deleted

## 2017-04-20 ENCOUNTER — Ambulatory Visit
Admission: RE | Admit: 2017-04-20 | Discharge: 2017-04-20 | Disposition: A | Discharge status: Home / Self Care | Payer: No Typology Code available for payment source | Source: Ambulatory Visit | Attending: Obstetrics and Gynecology | Admitting: Obstetrics and Gynecology

## 2017-04-20 VITALS — BP 152/100 | Temp 97.9°F | Wt 150.2 lb

## 2017-04-20 DIAGNOSIS — N63 Unspecified lump in unspecified breast: Secondary | ICD-10-CM

## 2017-04-20 DIAGNOSIS — N6321 Unspecified lump in the left breast, upper outer quadrant: Secondary | ICD-10-CM

## 2017-04-20 DIAGNOSIS — Z1239 Encounter for other screening for malignant neoplasm of breast: Secondary | ICD-10-CM

## 2017-04-20 DIAGNOSIS — N6320 Unspecified lump in the left breast, unspecified quadrant: Secondary | ICD-10-CM

## 2017-04-20 DIAGNOSIS — N644 Mastodynia: Secondary | ICD-10-CM

## 2017-04-20 DIAGNOSIS — N6313 Unspecified lump in the right breast, lower outer quadrant: Secondary | ICD-10-CM

## 2017-04-20 DIAGNOSIS — N6325 Unspecified lump in the left breast, overlapping quadrants: Secondary | ICD-10-CM

## 2017-04-20 NOTE — Addendum Note (Signed)
Encounter addended by: Loletta Parish, RN on: 04/20/2017  3:27 PM<BR>    Actions taken: Follow-up modified, Sign clinical note

## 2017-04-20 NOTE — Progress Notes (Addendum)
Complaints of bilateral breast lumps x 4 months. Patient complained of bilateral breast pain that comes and goes. Patient rates the pain at a 8 out of 10.   Pap Smear: Pap smear not completed today. Last Pap smear was in June 2018 at the [facility] and normal per patient. Per patient has a history of an abnormal Pap smear 2 years ago that a colposcopy was completed for follow-up. No Pap smear results are in EPIC.  Physical exam: Breasts Breasts symmetrical. No skin abnormalities bilateral breasts. No nipple retraction bilateral breasts. No nipple discharge bilateral breasts. No lymphadenopathy. Palpated three lumps within the left breast. Palpated a lump at 9 o'clock next to areola, Palpated a pea sized lump at 2 o'clock 8 cm from the nipple, and a BB sized lump at 3:30 o'clock 3 cm from the nipple. Palpated a lump within the right breast at 8 o'clock 5 cm from the nipple. Complaints of tenderness when palpated lumps. Referred patient to the [facility] for bilateral breast ultrasounds. Appointment scheduled for Thursday, April 20, 2017 at 1530.        Pelvic/Bimanual No Pap smear completed today since last Pap smear was in June 2018 per patient. Pap smear not indicated per BCCCP guidelines.   Smoking History: Patient has never smoked.  Patient Navigation: Patient education provided. Access to services provided for patient through [facility] program. Spanish interpreter provided.   Used Spanish interpreter Omer Jack from [place].

## 2017-04-20 NOTE — Patient Instructions (Addendum)
Explained breast self awareness with [name]. Patient did not need a Pap smear today due to last Pap smear was in June 2018 per patient. Let her know that her next Pap smear is due in June 2019 due to her recent history of an abnormal Pap smear. Referred patient to the [facility] for bilateral breast ultrasounds. Appointment scheduled for Thursday, April 20, 2017 at 1530. [name] verbalized understanding.  Brannock, Arvil Chaco, RN 3:19 PM

## 2017-04-21 ENCOUNTER — Encounter (HOSPITAL_COMMUNITY): Payer: Self-pay | Admitting: *Deleted

## 2018-10-04 LAB — CYTOLOGY - PAP: Pap: ABNORMAL — AB

## 2018-10-04 LAB — OB RESULTS CONSOLE VARICELLA ZOSTER ANTIBODY, IGG: Varicella: IMMUNE

## 2018-10-04 LAB — CYSTIC FIBROSIS DIAGNOSTIC STUDY: Interpretation-CFDNA:: NEGATIVE

## 2018-10-04 LAB — SICKLE CELL SCREEN: Sickle Cell Screen: NEGATIVE

## 2018-10-06 ENCOUNTER — Encounter (HOSPITAL_COMMUNITY): Payer: Self-pay

## 2018-10-06 ENCOUNTER — Ambulatory Visit (HOSPITAL_COMMUNITY)
Admission: EM | Admit: 2018-10-06 | Discharge: 2018-10-06 | Disposition: A | Discharge status: Home / Self Care | Payer: Medicaid Other | Attending: Physician Assistant | Admitting: Physician Assistant

## 2018-10-06 ENCOUNTER — Other Ambulatory Visit: Payer: Self-pay

## 2018-10-06 DIAGNOSIS — R519 Headache, unspecified: Secondary | ICD-10-CM

## 2018-10-06 DIAGNOSIS — R51 Headache: Secondary | ICD-10-CM

## 2018-10-06 DIAGNOSIS — Z3A10 10 weeks gestation of pregnancy: Secondary | ICD-10-CM

## 2018-10-06 MED ORDER — ACETAMINOPHEN 325 MG PO TABS
ORAL_TABLET | ORAL | Status: AC
  Filled 2018-10-06: qty 2

## 2018-10-06 MED ORDER — ACETAMINOPHEN 500 MG PO TABS: 500.0000 mg | ORAL_TABLET | Freq: Four times a day (QID) | ORAL | 0 refills | Status: DC | PRN

## 2018-10-06 MED ORDER — ACETAMINOPHEN 325 MG PO TABS
650.0000 mg | ORAL_TABLET | Freq: Once | ORAL | Status: AC
  Administered 2018-10-06: 650 mg via ORAL

## 2018-10-06 NOTE — ED Triage Notes (Signed)
Patient presents to Urgent Care with complaints of headache since last night. Patient states she has not taken any meds for it, no neuro defecits.

## 2018-10-06 NOTE — ED Provider Notes (Signed)
[facility]   [id number] 10/06/18 Arrival Time: 43  [id number]  SUBJECTIVE: HPI obtained from brother over the phone per patient preference  Jill Sexton is a 28 y.o. female [redacted] weeks pregnant, who complains of headache that began last night.  Denies a precipitating event, or recent head trauma.  Patient localizes her pain to the front of head.  Describes the pain as constant.  Patient has NOT tried OTC medications. Reports similar symptoms in the past that improved without intervention.  This is not the worst headache of their life.  Complains of mild nausea, and vomiting, but attributes these symptoms to her pregnancy.  Patient denies fever, chills, aura, rhinorrhea, watery eyes, chest pain, SOB, abdominal pain, weakness, numbness or tingling, vaginal bleeding.     ROS: As per HPI.  Past Medical History:  Diagnosis Date  . Gestational hypertension   . Medical history non-contributory    Past Surgical History:  Procedure Laterality Date  . NO PAST SURGERIES     No Known Allergies No current facility-administered medications on file prior to encounter.    Current Outpatient Medications on File Prior to Encounter  Medication Sig Dispense Refill  . ibuprofen (ADVIL,MOTRIN) 600 MG tablet Take 600 mg by mouth every 6 (six) hours as needed.     Social History   Socioeconomic History  . Marital status: Married    Spouse name: Not on file  . Number of children: Not on file  . Years of education: Not on file  . Highest education level: Not on file  Occupational History  . Not on file  Social Needs  . Financial resource strain: Not on file  . Food insecurity:    Worry: Not on file    Inability: Not on file  . Transportation needs:    Medical: Not on file    Non-medical: Not on file  Tobacco Use  . Smoking status: Never Smoker  . Smokeless tobacco: Never Used  Substance and Sexual Activity  . Alcohol use: No  . Drug use: No  . Sexual activity: Yes    Birth  control/protection: None, Implant  Lifestyle  . Physical activity:    Days per week: Not on file    Minutes per session: Not on file  . Stress: Not on file  Relationships  . Social connections:    Talks on phone: Not on file    Gets together: Not on file    Attends religious service: Not on file    Active member of club or organization: Not on file    Attends meetings of clubs or organizations: Not on file    Relationship status: Not on file  . Intimate partner violence:    Fear of current or ex partner: Not on file    Emotionally abused: Not on file    Physically abused: Not on file    Forced sexual activity: Not on file  Other Topics Concern  . Not on file  Social History Narrative  . Not on file   Family History  Problem Relation Age of Onset  . Hypertension Mother     OBJECTIVE:  Vitals:   10/06/18 1016  BP: 132/86  Pulse: 76  Resp: 16  Temp: 98.4 F (36.9 C)  TempSrc: Oral  SpO2: 100%    General appearance: alert; no distress Eyes: PERRLA; EOMI HENT: normocephalic; atraumatic; nares patent without rhinorrhea; oropharynx clear Neck: supple with FROM Lungs: clear to auscultation bilaterally Heart: regular rate and rhythm.  Radial  pulses 2+ symmetrical bilaterally Abdomen: soft, nondistended, normal active bowel sounds; nontender to palpation; no guarding  Extremities: no edema; symmetrical with no gross deformities Skin: warm and dry Neurologic: CN 2-12 grossly intact; normal gait; strength and sensation intact bilaterally about the upper and lower extremities Psychological: alert and cooperative; normal mood and affect  ASSESSMENT & PLAN:  1. Acute nonintractable headache, unspecified headache type   2. [redacted] weeks gestation of pregnancy     Meds ordered this encounter  Medications  . acetaminophen (TYLENOL) 500 MG tablet    Sig: Take 1 tablet (500 mg total) by mouth every 6 (six) hours as needed.    Dispense:  30 tablet    Refill:  0    Order  Specific Question:   Supervising Provider    Answer:   Raylene Everts [[id number]]   Rest and drink plenty of fluids Tylenol given in office Tylenol prescription sent to pharmacy on file.  Take as needed for pain.   Follow up with OB for scheduled appointment Medications safe in pregnancy also attached.  Follow up with PCP if symptoms persists Return or go to the ER if you have any new or worsening symptoms worsening headache, persistent vomiting, fever, chills, severe abdominal pain, vaginal bleeding, vision changes, changes in bowel or bladder habits, etc...  Reviewed expectations re: course of current medical issues. Questions answered. Outlined signs and symptoms indicating need for more acute intervention. Patient verbalized understanding. After Visit Summary given.   Lestine Box, PA-C 10/06/18 1042

## 2018-10-06 NOTE — Discharge Instructions (Signed)
Descanse y beba abundantes lquidos Tylenol dado en el cargo Tylenol receta enviada a la farmacia en el archivo.  Tome segn sea necesario para Conservation officer, historic buildings.   Seguimiento con OB para cita programada [place]ambin se adjuntan medicamentos seguros Solicitor.  Seguimiento con PCP si los sntomas persisten Regrese o vaya a Urgencias si tiene sntomas nuevos o empeorantes que empeoren el dolor de cabeza, vmitos persistentes, fiebre, escalofros, dolor abdominal intenso, sangrado vaginal, cambios en la visin, cambios en los hbitos intestinales o vesicales, etc...  Rest and drink plenty of fluids Tylenol given in office Tylenol prescription sent to pharmacy on file.  Take as needed for pain.   Follow up with OB for scheduled appointment Medications safe in pregnancy also attached.  Follow up with PCP if symptoms persists Return or go to the ER if you have any new or worsening symptoms worsening headache, persistent vomiting, fever, chills, severe abdominal pain, vaginal bleeding, vision changes, changes in bowel or bladder habits, etc..Marland Kitchen

## 2018-10-18 ENCOUNTER — Encounter (HOSPITAL_COMMUNITY): Payer: Self-pay | Admitting: *Deleted

## 2018-10-19 ENCOUNTER — Other Ambulatory Visit (HOSPITAL_COMMUNITY): Payer: Self-pay | Admitting: Family Medicine

## 2018-10-19 DIAGNOSIS — Z3682 Encounter for antenatal screening for nuchal translucency: Secondary | ICD-10-CM

## 2018-10-19 DIAGNOSIS — Z3A13 13 weeks gestation of pregnancy: Secondary | ICD-10-CM

## 2018-10-22 ENCOUNTER — Ambulatory Visit (HOSPITAL_COMMUNITY): Payer: No Typology Code available for payment source | Admitting: *Deleted

## 2018-10-22 ENCOUNTER — Other Ambulatory Visit: Payer: Self-pay

## 2018-10-22 ENCOUNTER — Telehealth: Payer: Self-pay | Admitting: Advanced Practice Midwife

## 2018-10-22 ENCOUNTER — Ambulatory Visit (HOSPITAL_COMMUNITY)
Admission: RE | Admit: 2018-10-22 | Discharge: 2018-10-22 | Disposition: A | Discharge status: Home / Self Care | Payer: Medicaid Other | Source: Ambulatory Visit | Attending: Obstetrics and Gynecology | Admitting: Obstetrics and Gynecology

## 2018-10-22 ENCOUNTER — Ambulatory Visit (HOSPITAL_COMMUNITY): Payer: No Typology Code available for payment source

## 2018-10-22 ENCOUNTER — Encounter (HOSPITAL_COMMUNITY): Payer: Self-pay

## 2018-10-22 VITALS — BP 122/76 | HR 83 | Temp 98.3°F

## 2018-10-22 DIAGNOSIS — Z3682 Encounter for antenatal screening for nuchal translucency: Secondary | ICD-10-CM

## 2018-10-22 DIAGNOSIS — Z3A13 13 weeks gestation of pregnancy: Secondary | ICD-10-CM | POA: Diagnosis not present

## 2018-10-22 DIAGNOSIS — O99211 Obesity complicating pregnancy, first trimester: Secondary | ICD-10-CM

## 2018-10-22 NOTE — Telephone Encounter (Signed)
Called patient with Spanish interpreter ID # [id number] to get her scheduled for her SAB follow-up with a MD. Patient verbalized understanding of appointment on 5/6 @ 10:35.

## 2018-10-25 ENCOUNTER — Ambulatory Visit (INDEPENDENT_AMBULATORY_CARE_PROVIDER_SITE_OTHER): Payer: Medicaid Other | Admitting: Obstetrics & Gynecology

## 2018-10-25 ENCOUNTER — Other Ambulatory Visit: Payer: Self-pay

## 2018-10-25 ENCOUNTER — Encounter: Payer: Self-pay | Admitting: Obstetrics & Gynecology

## 2018-10-25 ENCOUNTER — Other Ambulatory Visit: Payer: Self-pay | Admitting: Obstetrics & Gynecology

## 2018-10-25 VITALS — BP 136/80 | Temp 98.6°F | Wt 172.4 lb

## 2018-10-25 DIAGNOSIS — O021 Missed abortion: Secondary | ICD-10-CM

## 2018-10-25 NOTE — Progress Notes (Signed)
Patient ID: Jill Sexton, female   DOB: [birth year], 28 y.o.   MRN: [id number]  Chief Complaint  Patient presents with  . Gynecologic Exam   Missed abortion by [place] 4/27 HPI Jill Sexton is a 28 y.o. female.  G2P1001 [redacted]w[redacted]d Missed abortion was diagnosed 4/27 c/w 10 weeks 2 days by CRL. No sx of pain or bleeding HPI  Past Medical History:  Diagnosis Date  . Gestational hypertension   . Medical history non-contributory     Past Surgical History:  Procedure Laterality Date  . NO PAST SURGERIES      Family History  Problem Relation Age of Onset  . Hypertension Mother     Social History Social History   Tobacco Use  . Smoking status: Never Smoker  . Smokeless tobacco: Never Used  Substance Use Topics  . Alcohol use: No  . Drug use: No    No Known Allergies  Current Outpatient Medications  Medication Sig Dispense Refill  . acetaminophen (TYLENOL) 500 MG tablet Take 1 tablet (500 mg total) by mouth every 6 (six) hours as needed. (Patient not taking: Reported on 10/22/2018) 30 tablet 0  . ibuprofen (ADVIL,MOTRIN) 600 MG tablet Take 600 mg by mouth every 6 (six) hours as needed.     No current facility-administered medications for this visit.     Review of Systems Review of Systems  Constitutional: Negative.   Gastrointestinal: Negative.   Genitourinary: Negative.     Blood pressure 136/80, temperature 98.6 F (37 C), weight 172 lb 6.4 oz (78.2 kg), last menstrual period 07/24/2018, unknown if currently breastfeeding.  Physical Exam Physical Exam Constitutional:      Appearance: Normal appearance.  Cardiovascular:     Rate and Rhythm: Normal rate.  Pulmonary:     Effort: Pulmonary effort is normal. No respiratory distress.  Abdominal:     General: Abdomen is flat.  Musculoskeletal: Normal range of motion.  Neurological:     Mental Status: She is alert.  Psychiatric:        Mood and Affect: Mood normal.        Behavior: Behavior normal.     Data   Assessment Missed abortion 10.2 weeks by [place] on 4/27  Plan Offered suction D&C which will be scheduled as an outpatient. Patient desires surgical management with D&E.  The risks of surgery were discussed in detail with the patient including but not limited to: bleeding which may require transfusion or reoperation; infection which may require prolonged hospitalization or re-hospitalization and antibiotic therapy; injury to bowel, bladder, ureters and major vessels or other surrounding organs; need for additional procedures including laparotomy; thromboembolic phenomenon, incisional problems and other postoperative or anesthesia complications.  Patient was told that the likelihood that her condition and symptoms will be treated effectively with this surgical management was very high; the postoperative expectations were also discussed in detail. The patient also understands the alternative treatment options which were discussed in full. All questions were answered.  She was told that she will be contacted by our surgical scheduler regarding the time and date of her surgery; routine preoperative instructions of having nothing to eat or drink after midnight on the day prior to surgery and also coming to the hospital 1.5 hours prior to her time of surgery were also emphasized.  She was told she may be called for a preoperative appointment about a week prior to surgery and will be given further preoperative instructions at that visit. Printed patient education handouts about the procedure were  given to the patient to review at home.      Emeterio Reeve 10/25/2018, 11:52 AM

## 2018-10-25 NOTE — H&P (View-Only) (Signed)
Patient ID: Jill Sexton, female   DOB: 1991-05-27, 28 y.o.   MRN: [id number]  Chief Complaint  Patient presents with  . Gynecologic Exam   Missed abortion by [place] 4/27 HPI Jill Sexton is a 28 y.o. female.  G2P1001 [redacted]w[redacted]d Missed abortion was diagnosed 4/27 c/w 10 weeks 2 days by CRL. No sx of pain or bleeding HPI  Past Medical History:  Diagnosis Date  . Gestational hypertension   . Medical history non-contributory     Past Surgical History:  Procedure Laterality Date  . NO PAST SURGERIES      Family History  Problem Relation Age of Onset  . Hypertension Mother     Social History Social History   Tobacco Use  . Smoking status: Never Smoker  . Smokeless tobacco: Never Used  Substance Use Topics  . Alcohol use: No  . Drug use: No    No Known Allergies  Current Outpatient Medications  Medication Sig Dispense Refill  . acetaminophen (TYLENOL) 500 MG tablet Take 1 tablet (500 mg total) by mouth every 6 (six) hours as needed. (Patient not taking: Reported on 10/22/2018) 30 tablet 0  . ibuprofen (ADVIL,MOTRIN) 600 MG tablet Take 600 mg by mouth every 6 (six) hours as needed.     No current facility-administered medications for this visit.     Review of Systems Review of Systems  Constitutional: Negative.   Gastrointestinal: Negative.   Genitourinary: Negative.     Blood pressure 136/80, temperature 98.6 F (37 C), weight 172 lb 6.4 oz (78.2 kg), last menstrual period 07/24/2018, unknown if currently breastfeeding.  Physical Exam Physical Exam Constitutional:      Appearance: Normal appearance.  Cardiovascular:     Rate and Rhythm: Normal rate.  Pulmonary:     Effort: Pulmonary effort is normal. No respiratory distress.  Abdominal:     General: Abdomen is flat.  Musculoskeletal: Normal range of motion.  Neurological:     Mental Status: She is alert.  Psychiatric:        Mood and Affect: Mood normal.        Behavior: Behavior normal.     Data   Assessment Missed abortion 10.2 weeks by [place] on 4/27  Plan Offered suction D&C which will be scheduled as an outpatient. Patient desires surgical management with D&E.  The risks of surgery were discussed in detail with the patient including but not limited to: bleeding which may require transfusion or reoperation; infection which may require prolonged hospitalization or re-hospitalization and antibiotic therapy; injury to bowel, bladder, ureters and major vessels or other surrounding organs; need for additional procedures including laparotomy; thromboembolic phenomenon, incisional problems and other postoperative or anesthesia complications.  Patient was told that the likelihood that her condition and symptoms will be treated effectively with this surgical management was very high; the postoperative expectations were also discussed in detail. The patient also understands the alternative treatment options which were discussed in full. All questions were answered.  She was told that she will be contacted by our surgical scheduler regarding the time and date of her surgery; routine preoperative instructions of having nothing to eat or drink after midnight on the day prior to surgery and also coming to the hospital 1.5 hours prior to her time of surgery were also emphasized.  She was told she may be called for a preoperative appointment about a week prior to surgery and will be given further preoperative instructions at that visit. Printed patient education handouts about the procedure were  given to the patient to review at home.      Jill Sexton 10/25/2018, 11:52 AM

## 2018-10-25 NOTE — Progress Notes (Signed)
Orders for D&E

## 2018-10-26 ENCOUNTER — Inpatient Hospital Stay (HOSPITAL_COMMUNITY)
Admission: AD | Admit: 2018-10-26 | Discharge: 2018-10-26 | Disposition: A | Discharge status: Home / Self Care | Payer: Medicaid Other | Attending: Obstetrics and Gynecology | Admitting: Obstetrics and Gynecology

## 2018-10-26 ENCOUNTER — Encounter (HOSPITAL_COMMUNITY): Payer: Self-pay | Admitting: *Deleted

## 2018-10-26 ENCOUNTER — Other Ambulatory Visit: Payer: Self-pay

## 2018-10-26 DIAGNOSIS — O360110 Maternal care for anti-D [Rh] antibodies, first trimester, not applicable or unspecified: Secondary | ICD-10-CM

## 2018-10-26 DIAGNOSIS — Z8249 Family history of ischemic heart disease and other diseases of the circulatory system: Secondary | ICD-10-CM | POA: Insufficient documentation

## 2018-10-26 DIAGNOSIS — O021 Missed abortion: Secondary | ICD-10-CM | POA: Diagnosis not present

## 2018-10-26 DIAGNOSIS — O131 Gestational [pregnancy-induced] hypertension without significant proteinuria, first trimester: Secondary | ICD-10-CM | POA: Insufficient documentation

## 2018-10-26 DIAGNOSIS — Z3A13 13 weeks gestation of pregnancy: Secondary | ICD-10-CM | POA: Diagnosis not present

## 2018-10-26 DIAGNOSIS — O219 Vomiting of pregnancy, unspecified: Secondary | ICD-10-CM | POA: Insufficient documentation

## 2018-10-26 LAB — URINALYSIS, ROUTINE W REFLEX MICROSCOPIC
Bilirubin Urine: NEGATIVE
Glucose, UA: NEGATIVE mg/dL
Ketones, ur: NEGATIVE mg/dL
Leukocytes,Ua: NEGATIVE
Nitrite: NEGATIVE
Protein, ur: NEGATIVE mg/dL
Specific Gravity, Urine: 1.008 (ref 1.005–1.030)
pH: 5 (ref 5.0–8.0)

## 2018-10-26 LAB — CBC WITH DIFFERENTIAL/PLATELET
Abs Immature Granulocytes: 0.04 10*3/uL (ref 0.00–0.07)
Basophils Absolute: 0 10*3/uL (ref 0.0–0.1)
Basophils Relative: 0 %
Eosinophils Absolute: 0.1 10*3/uL (ref 0.0–0.5)
Eosinophils Relative: 1 %
HCT: 35 % — ABNORMAL LOW (ref 36.0–46.0)
Hemoglobin: 12.3 g/dL (ref 12.0–15.0)
Immature Granulocytes: 0 %
Lymphocytes Relative: 28 %
Lymphs Abs: 2.9 10*3/uL (ref 0.7–4.0)
MCH: 29.8 pg (ref 26.0–34.0)
MCHC: 35.1 g/dL (ref 30.0–36.0)
MCV: 84.7 fL (ref 80.0–100.0)
Monocytes Absolute: 0.7 10*3/uL (ref 0.1–1.0)
Monocytes Relative: 7 %
Neutro Abs: 6.5 10*3/uL (ref 1.7–7.7)
Neutrophils Relative %: 64 %
Platelets: 285 10*3/uL (ref 150–400)
RBC: 4.13 MIL/uL (ref 3.87–5.11)
RDW: 12.6 % (ref 11.5–15.5)
WBC: 10.3 10*3/uL (ref 4.0–10.5)
nRBC: 0 % (ref 0.0–0.2)

## 2018-10-26 MED ORDER — RHO D IMMUNE GLOBULIN 1500 UNIT/2ML IJ SOSY
300.0000 ug | PREFILLED_SYRINGE | Freq: Once | INTRAMUSCULAR | Status: AC
  Administered 2018-10-26: 300 ug via INTRAMUSCULAR
  Filled 2018-10-26: qty 2

## 2018-10-26 NOTE — Discharge Instructions (Signed)
Dilatación y curetaje o curetaje por aspiración  Dilation and Curettage or Vacuum Curettage    La dilatación y curetaje (D y C) y el curetaje por aspiración son procedimientos menores. Una D y C consiste en la apertura (dilatación) del cuello uterino y el raspado (curetaje) de la superficie interna del útero (endometrio). Durante una D y C, se raspa suavemente el tejido del endometrio, desde la parte superior del útero hasta la parte inferior del útero (cuello uterino). Durante el curetaje por aspiración, se utiliza una succión suave para extirpar tejidos del útero.  El curetaje podrá realizarse para obtener un diagnóstico o para tratar un problema. Como procedimiento de diagnóstico, el curetaje se realiza para examinar los tejidos del útero. Un curetaje de diagnóstico puede realizarse si usted tiene los siguientes síntomas:  · Sangrado irregular en el útero.  · Hemorragias con coágulos.  · Pérdida de sangre entre los períodos menstruales.  · Períodos menstruales prolongados u otro tipo de sangrado anormal.  · Sangrado luego de la menopausia.  · Ausencia de períodos menstruales (amenorrea).  · Cambio en el tamaño y forma del útero.  · Presencia de células endometriales anormales en una prueba de Papan[place]olaou.  Como tratamiento, el curetaje podrá realizarse por las siguientes causas:  · Remoción de un DIU (dispositivo intrauterino).  · Remoción de placenta retenida luego del parto.  · Aborto.  · Aborto espontáneo.  · Extirpación de pólipos endometriales.  · Extirpación de tipos poco comunes de bultos no cancerosos (fibromas).  Informe al médico acerca de lo siguiente:  · Cualquier alergia que tenga, incluidas las alergias a medicamentos recetados o al látex.  · Todos los medicamentos que utiliza, incluidos vitaminas, hierbas, gotas oftálmicas, cremas y medicamentos de venta libre. Esto es muy importante si toma algún anticoagulante. Lleve una lista de todos los medicamentos que toma a la consulta.  · Cualquier  problema previo que usted o los miembros de su familia hayan tenido con anestésicos.  · Cualquier enfermedad de la sangre que tenga.  · Cirugías previas a las que se sometió.  · Sus antecedentes médicos y cualquier afección médica que tenga.  · Si está embarazada o podría estarlo.  · Si ha sufrido una infección vaginal recientemente.  · Los períodos menstruales recientes, problemas de sangrado que haya tenido y el método anticonceptivo que usa.  ¿Cuáles son los riesgos?  En general, se trata de un procedimiento seguro. Sin embargo, pueden ocurrir complicaciones, por ejemplo:  · Infección.  · Sangrado vaginal abundante.  · Reacciones alérgicas a los medicamentos.  · Daños en el cuello uterino u otras estructuras u órganos.  · Desarrollo de tejido cicatricial (adherencias) dentro del útero que posteriormente causan hemorragia menstrual en cantidad anormal. Esto puede dificultar que quede embarazada en el futuro.  · Un orificio (perforación) o punción en la pared uterina. Esto es raro.  ¿Qué ocurre antes del procedimiento?  Mantenerse hidratado  Siga las indicaciones del médico acerca de la hidratación, las cuales pueden incluir lo siguiente:  · Hasta 2 horas antes del procedimiento, puede beber líquidos transparentes, como agua, jugos frutales transparentes, café negro y té solo.  Restricciones en las comidas y bebidas  Siga las indicaciones del médico respecto de las comidas y bebidas, las cuales pueden incluir lo siguiente:  · Ocho horas antes del procedimiento, deje de ingerir comidas o alimentos pesados, por ejemplo, carne, alimentos fritos o alimentos grasos.  · Seis horas antes del procedimiento, deje de ingerir comidas o alimentos livianos, como tostadas o cereales.  ·   Seis horas antes del procedimiento, deje de beber leche o bebidas que contengan leche.  · Dos horas antes del procedimiento, deje de beber líquidos transparentes. Si el médico le indicó que tome medicamentos el día del procedimiento, tómelos solo  con un trago de agua.  Medicamentos  · Consulte al médico si debe hacer o no lo siguiente:  ? Cambiar o suspender los medicamentos que toma habitualmente. Esto es muy importante si toma medicamentos para la diabetes o anticoagulantes.  ? Tomar medicamentos como aspirina e ibuprofeno. Estos medicamentos pueden tener un efecto anticoagulante en la sangre. No tome estos medicamentos antes del procedimiento si el médico le indica que no lo haga.  · Pueden indicarle un antibiótico para ayudar a prevenir infecciones.  Instrucciones generales  · No haga nada de lo siguiente durante 24 horas antes del procedimiento:  ? Realizar duchas vaginales.  ? Usar tampones.  ? Aplicarse medicamentos, cremas o supositorios en la vagina.  ? Tener relaciones sexuales.  · Es posible que le realicen una prueba de embarazo el día del procedimiento.  · Haga planes para que una persona lo lleve a su casa desde el hospital o la clínica.  · Pueden extraerle una muestra de sangre o pedirle una muestra de orina.  · Si se irá a su casa inmediatamente después del procedimiento, planifique que alguien se quede con usted durante 24 horas.  ¿Qué ocurre durante el procedimiento?  · Para disminuir el riesgo de contraer una infección:  ? El equipo médico se lavará o se desinfectará las manos.  ? Le lavarán la piel con jabón.  · Le colocarán un tubo (catéter) intravenoso en una de las venas.  · Le administrarán uno de los siguientes medicamentos:  ? Medicamentos que adormecen el área del cuello uterino (anestesia local).  ? Un medicamento que lo hará dormir (anestesia general).  · Estará acostada boca arriba, con los pies en los soportes (estribos).  · Se controlarán el tamaño y la posición de su útero.  · Se insertará un instrumento lubricado (espéculo o retractor de Sims) en la parte posterior de la vagina. El espéculo se usará para separar las paredes de la vagina de modo que el médico pueda ver el cuello uterino.  · Se fijará una herramienta  (tenáculo) al borde del cuello uterino para estabilizarlo.  · Se ablandará y dilatará el cuello uterino. Esto se puede hacer de la siguiente manera:  ? Tomar medicamentos.  ? Insertar dilatadores cónicos o varillas delgadas (laminarias) o instrumentos que ensanchan progresivamente (dilatadores progresivos) en el cuello uterino.  · Se utilizará un instrumento pequeño, curvo y filoso (raspador) para raspar una pequeña cantidad de tejido o células del endometrio o del conducto cervicouterino. En algunos casos, se aplica una succión suave con el raspador. Luego retirarán el raspador. Las células se enviarán a un laboratorio para su análisis.  Este procedimiento puede variar según el médico y el hospital.  ¿Qué sucede después del procedimiento?  · Puede tener calambres leves, dolor de espalda, dolor y sangrado leve o manchas. Puede eliminar coágulos de sangre pequeños por la vagina.  · Quizás deba usar medias de compresión. Estas medias ayudan a evitar la formación de coágulos de sangre y a reducir la hinchazón de las piernas.  · Le controlarán la presión arterial, la frecuencia cardíaca, la frecuencia respiratoria y el nivel de oxígeno en la sangre hasta que haya desaparecido el efecto de los medicamentos administrados.  Resumen  · La dilatación y curetaje (D y C) consiste en   la apertura (dilatación) del cuello uterino y el raspado (curetaje) de la superficie interna del útero (endometrio).  · Después del procedimiento, puede tener calambres leves, dolor de espalda, dolor y sangrado leve o manchas. Puede eliminar coágulos de sangre pequeños por la vagina.  · Haga planes para que una persona lo lleve a su casa desde el hospital o la clínica.  Esta información no tiene como fin reemplazar el consejo del médico. Asegúrese de hacerle al médico cualquier pregunta que tenga.  Document Released: 11/30/2007 Document Revised: 12/15/2016 Document Reviewed: 01/10/2013  Elsevier Interactive Patient Education © 2019 [employer]  Inc[employer]

## 2018-10-26 NOTE — MAU Provider Note (Signed)
Chief Complaint: Abdominal Pain   First Provider Initiated Contact with Patient 10/26/18 1853     SUBJECTIVE HPI: Jill Sexton is a 28 y.o. G2P1001 at [redacted]w[redacted]d who presents to [facility] reporting abdominal pain. She was diagnosed with a 10 wk missed AB yesterday & is waiting for her outpatient D&C to be scheduled.  States she called the office & was told to come to MAU for abdominal pain.  Current symptoms started last night. Reports intermittent lower abdominal cramping & some red spotting. Also reports n/v but states this has been a continued issue since she found out she was pregnant.  Denies fever/chills, dysuria, vaginal discharge, or any respiratory symptoms.   Location: abdomen Quality: cramping Severity: 5/10 on pain scale Duration: 1 day Timing: intermittent Modifying factors: none Associated signs and symptoms: vaginal spotting  Past Medical History:  Diagnosis Date  . Gestational hypertension   . Medical history non-contributory    OB History  Gravida Para Term Preterm AB Living  2 1 1  0 0 1  SAB TAB Ectopic Multiple Live Births  0 0 0 0 1    # Outcome Date GA Lbr Len/2nd Weight Sex Delivery Anes PTL Lv  2 Gravida           1 Term 07/17/15 [redacted]w[redacted]d / 00:17 2670 g F Vag-Spont None  LIV   Past Surgical History:  Procedure Laterality Date  . NO PAST SURGERIES     Social History   Socioeconomic History  . Marital status: Married    Spouse name: Not on file  . Number of children: Not on file  . Years of education: Not on file  . Highest education level: Not on file  Occupational History  . Not on file  Social Needs  . Financial resource strain: Not on file  . Food insecurity:    Worry: Not on file    Inability: Not on file  . Transportation needs:    Medical: Not on file    Non-medical: Not on file  Tobacco Use  . Smoking status: Never Smoker  . Smokeless tobacco: Never Used  Substance and Sexual Activity  . Alcohol use: No  . Drug use: No  .  Sexual activity: Yes    Birth control/protection: None  Lifestyle  . Physical activity:    Days per week: Not on file    Minutes per session: Not on file  . Stress: Not on file  Relationships  . Social connections:    Talks on phone: Not on file    Gets together: Not on file    Attends religious service: Not on file    Active member of club or organization: Not on file    Attends meetings of clubs or organizations: Not on file    Relationship status: Not on file  . Intimate partner violence:    Fear of current or ex partner: Not on file    Emotionally abused: Not on file    Physically abused: Not on file    Forced sexual activity: Not on file  Other Topics Concern  . Not on file  Social History Narrative  . Not on file   Family History  Problem Relation Age of Onset  . Hypertension Mother    No current facility-administered medications on file prior to encounter.    Current Outpatient Medications on File Prior to Encounter  Medication Sig Dispense Refill  . acetaminophen (TYLENOL) 500 MG tablet Take 1 tablet (500 mg total) by mouth  every 6 (six) hours as needed. 30 tablet 0  . ibuprofen (ADVIL,MOTRIN) 600 MG tablet Take 600 mg by mouth every 6 (six) hours as needed.     No Known Allergies  I have reviewed patient's Past Medical Hx, Surgical Hx, Family Hx, Social Hx, medications and allergies.   Review of Systems  Constitutional: Negative.   Gastrointestinal: Positive for abdominal pain, nausea and vomiting. Negative for constipation and diarrhea.  Genitourinary: Positive for vaginal bleeding. Negative for dysuria and vaginal discharge.    OBJECTIVE Patient Vitals for the past 24 hrs:  BP Temp Temp src Pulse Resp SpO2 Weight  10/26/18 2036 125/78 98.9 F (37.2 C) Oral 76 16 - -  10/26/18 1910 - 99.4 F (37.4 C) Oral - - - -  10/26/18 1820 135/79 100 F (37.8 C) Oral 80 16 99 % 78.7 kg   Constitutional: Well-developed, well-nourished female in no acute distress.   Cardiovascular: normal rate & rhythm, no murmur Respiratory: normal rate and effort. Lung sounds clear throughout GI: Reports mild TTP over uterus. Abd soft & otherwise non-tender, Pos BS x 4. No guarding or rebound tenderness MS: Extremities nontender, no edema, normal ROM Neurologic: Alert and oriented x 4.  GU:   Cervix closed. Minimal amount of red mucoid blood   LAB RESULTS Results for orders placed or performed during the hospital encounter of 10/26/18 (from the past 24 hour(s))  Urinalysis, Routine w reflex microscopic     Status: Abnormal   Collection Time: 10/26/18  7:06 PM  Result Value Ref Range   Color, Urine YELLOW YELLOW   APPearance HAZY (A) CLEAR   Specific Gravity, Urine 1.008 1.005 - 1.030   pH 5.0 5.0 - 8.0   Glucose, UA NEGATIVE NEGATIVE mg/dL   Hgb urine dipstick MODERATE (A) NEGATIVE   Bilirubin Urine NEGATIVE NEGATIVE   Ketones, ur NEGATIVE NEGATIVE mg/dL   Protein, ur NEGATIVE NEGATIVE mg/dL   Nitrite NEGATIVE NEGATIVE   Leukocytes,Ua NEGATIVE NEGATIVE   RBC / HPF 6-10 0 - 5 RBC/hpf   WBC, UA 0-5 0 - 5 WBC/hpf   Bacteria, UA RARE (A) NONE SEEN   Squamous Epithelial / LPF 6-10 0 - 5   Mucus PRESENT   CBC with Differential/Platelet     Status: Abnormal   Collection Time: 10/26/18  7:43 PM  Result Value Ref Range   WBC 10.3 4.0 - 10.5 K/uL   RBC 4.13 3.87 - 5.11 MIL/uL   Hemoglobin 12.3 12.0 - 15.0 g/dL   HCT 35.0 (L) 36.0 - 46.0 %   MCV 84.7 80.0 - 100.0 fL   MCH 29.8 26.0 - 34.0 pg   MCHC 35.1 30.0 - 36.0 g/dL   RDW 12.6 11.5 - 15.5 %   Platelets 285 150 - 400 K/uL   nRBC 0.0 0.0 - 0.2 %   Neutrophils Relative % 64 %   Neutro Abs 6.5 1.7 - 7.7 K/uL   Lymphocytes Relative 28 %   Lymphs Abs 2.9 0.7 - 4.0 K/uL   Monocytes Relative 7 %   Monocytes Absolute 0.7 0.1 - 1.0 K/uL   Eosinophils Relative 1 %   Eosinophils Absolute 0.1 0.0 - 0.5 K/uL   Basophils Relative 0 %   Basophils Absolute 0.0 0.0 - 0.1 K/uL   Immature Granulocytes 0 %   Abs  Immature Granulocytes 0.04 0.00 - 0.07 K/uL  Rh IG workup (includes ABO/Rh)     Status: None (Preliminary result)   Collection Time: 10/26/18  7:55 PM  Result Value Ref Range   Gestational Age(Wks) 13    ABO/RH(D) O NEG    Antibody Screen NEG    Unit Number [id number]    Blood Component Type RHIG    Unit division 00    Status of Unit ALLOCATED    Transfusion Status      OK TO TRANSFUSE Performed at [facility] [address], [place], [place] [address]     IMAGING No results found.  MAU COURSE Orders Placed This Encounter  Procedures  . Urinalysis, Routine w reflex microscopic  . CBC with Differential/Platelet  . Rh IG workup (includes ABO/Rh)  . Discharge patient   Meds ordered this encounter  Medications  . rho (d) immune globulin (RHIG/RHOPHYLAC) injection 300 mcg    MDM RH negative - will give rhogam here in MAU.  Pt with temp of 100 in triage, 99.4 on recheck. No respiratory symptoms. N/v symptoms have been ongoing for over a month.  Temp rechecked prior to discharge & down to 98.9 without medication.   Will check CBC.  No leukocytosis  C/w Dr. Ilda Basset. Ok to discharge home.   ASSESSMENT 1. Missed abortion     PLAN Discharge home in stable condition. Bleeding & infection precautions reviewed  [place] [facility] Follow up.   Specialty:  Obstetrics and Gynecology Why:  The office will call you to schedule your surgery Contact information: [address], [place] [id number] [place] [phone]         Allergies as of 10/26/2018   No Known Allergies     Medication List    TAKE these medications   acetaminophen 500 MG tablet Commonly known as:  TYLENOL Take 1 tablet (500 mg total) by mouth every 6 (six) hours as needed.   ibuprofen 600 MG tablet Commonly known as:  ADVIL Take 600 mg by mouth every 6 (six) hours as needed.         Jorje Guild, NP 10/26/2018  8:59 PM

## 2018-10-26 NOTE — MAU Note (Signed)
Called the clinic, was told if feeling pain to come in here.  Has been throwing up and having some cramping in lower abd.  Was dx with a failed pregnancy. Offered D&C or expected management.

## 2018-10-27 LAB — RH IG WORKUP (INCLUDES ABO/RH)
ABO/RH(D): O NEG
Antibody Screen: NEGATIVE
Gestational Age(Wks): 13
Unit division: 0

## 2018-10-29 ENCOUNTER — Telehealth: Payer: Self-pay

## 2018-10-29 NOTE — Telephone Encounter (Signed)
Used [employer], interpreter ID [phone]  Called given surgery date & time and pre-op instructions, patient expressed understanding, advised to call back if she had any questions.

## 2018-10-30 ENCOUNTER — Ambulatory Visit (HOSPITAL_BASED_OUTPATIENT_CLINIC_OR_DEPARTMENT_OTHER): Payer: Self-pay | Admitting: Anesthesiology

## 2018-10-30 ENCOUNTER — Other Ambulatory Visit: Payer: Self-pay

## 2018-10-30 ENCOUNTER — Encounter (HOSPITAL_BASED_OUTPATIENT_CLINIC_OR_DEPARTMENT_OTHER)
Admission: RE | Disposition: A | Discharge status: Home / Self Care | Payer: Self-pay | Source: Home / Self Care | Attending: Obstetrics & Gynecology

## 2018-10-30 ENCOUNTER — Ambulatory Visit (HOSPITAL_BASED_OUTPATIENT_CLINIC_OR_DEPARTMENT_OTHER)
Admission: RE | Admit: 2018-10-30 | Discharge: 2018-10-30 | Disposition: A | Discharge status: Home / Self Care | Payer: Medicaid Other | Attending: Obstetrics & Gynecology | Admitting: Obstetrics & Gynecology

## 2018-10-30 ENCOUNTER — Encounter (HOSPITAL_BASED_OUTPATIENT_CLINIC_OR_DEPARTMENT_OTHER): Payer: Self-pay | Admitting: Anesthesiology

## 2018-10-30 DIAGNOSIS — Z3A13 13 weeks gestation of pregnancy: Secondary | ICD-10-CM | POA: Insufficient documentation

## 2018-10-30 DIAGNOSIS — O021 Missed abortion: Secondary | ICD-10-CM | POA: Diagnosis not present

## 2018-10-30 DIAGNOSIS — Z791 Long term (current) use of non-steroidal anti-inflammatories (NSAID): Secondary | ICD-10-CM | POA: Insufficient documentation

## 2018-10-30 DIAGNOSIS — O161 Unspecified maternal hypertension, first trimester: Secondary | ICD-10-CM | POA: Insufficient documentation

## 2018-10-30 DIAGNOSIS — Z8249 Family history of ischemic heart disease and other diseases of the circulatory system: Secondary | ICD-10-CM | POA: Insufficient documentation

## 2018-10-30 HISTORY — DX: Missed abortion: O02.1

## 2018-10-30 HISTORY — PX: DILATION AND EVACUATION: SHX1459

## 2018-10-30 SURGERY — DILATION AND EVACUATION, UTERUS
Anesthesia: General | Site: Vagina

## 2018-10-30 MED ORDER — FENTANYL CITRATE (PF) 100 MCG/2ML IJ SOLN
50.0000 ug | INTRAMUSCULAR | Status: DC | PRN
  Administered 2018-10-30 (×2): 50 ug via INTRAVENOUS

## 2018-10-30 MED ORDER — METHYLERGONOVINE MALEATE 0.2 MG/ML IJ SOLN
INTRAMUSCULAR | Status: AC
  Filled 2018-10-30: qty 1

## 2018-10-30 MED ORDER — BUPIVACAINE-EPINEPHRINE (PF) 0.5% -1:200000 IJ SOLN
INTRAMUSCULAR | Status: AC
  Filled 2018-10-30: qty 30

## 2018-10-30 MED ORDER — KETOROLAC TROMETHAMINE 30 MG/ML IJ SOLN
30.0000 mg | Freq: Once | INTRAMUSCULAR | Status: AC
  Administered 2018-10-30: 30 mg via INTRAVENOUS

## 2018-10-30 MED ORDER — FENTANYL CITRATE (PF) 100 MCG/2ML IJ SOLN
INTRAMUSCULAR | Status: AC
  Filled 2018-10-30: qty 2

## 2018-10-30 MED ORDER — PROPOFOL 500 MG/50ML IV EMUL
INTRAVENOUS | Status: AC
  Filled 2018-10-30: qty 50

## 2018-10-30 MED ORDER — SODIUM CHLORIDE 0.9 % IV SOLN
INTRAVENOUS | Status: DC | PRN
  Administered 2018-10-30: 10:00:00 10 [IU] via INTRAVENOUS

## 2018-10-30 MED ORDER — ONDANSETRON HCL 4 MG/2ML IJ SOLN
INTRAMUSCULAR | Status: DC | PRN
  Administered 2018-10-30: 4 mg via INTRAVENOUS

## 2018-10-30 MED ORDER — SCOPOLAMINE 1 MG/3DAYS TD PT72: 1.0000 | MEDICATED_PATCH | Freq: Once | TRANSDERMAL | Status: DC | PRN

## 2018-10-30 MED ORDER — LIDOCAINE 2% (20 MG/ML) 5 ML SYRINGE
INTRAMUSCULAR | Status: AC
  Filled 2018-10-30: qty 5

## 2018-10-30 MED ORDER — MIDAZOLAM HCL 2 MG/2ML IJ SOLN
1.0000 mg | INTRAMUSCULAR | Status: DC | PRN
  Administered 2018-10-30: 10:00:00 2 mg via INTRAVENOUS

## 2018-10-30 MED ORDER — ONDANSETRON HCL 4 MG/2ML IJ SOLN
INTRAMUSCULAR | Status: AC
  Filled 2018-10-30: qty 2

## 2018-10-30 MED ORDER — FENTANYL CITRATE (PF) 100 MCG/2ML IJ SOLN
25.0000 ug | INTRAMUSCULAR | Status: DC | PRN
  Administered 2018-10-30: 25 ug via INTRAVENOUS
  Administered 2018-10-30: 50 ug via INTRAVENOUS

## 2018-10-30 MED ORDER — CARBOPROST TROMETHAMINE 250 MCG/ML IM SOLN
INTRAMUSCULAR | Status: AC
  Filled 2018-10-30: qty 1

## 2018-10-30 MED ORDER — DEXAMETHASONE SODIUM PHOSPHATE 10 MG/ML IJ SOLN
INTRAMUSCULAR | Status: AC
  Filled 2018-10-30: qty 1

## 2018-10-30 MED ORDER — LACTATED RINGERS IV SOLN
INTRAVENOUS | Status: DC
  Administered 2018-10-30: 13:00:00 via INTRAVENOUS

## 2018-10-30 MED ORDER — IBUPROFEN 600 MG PO TABS: 600.0000 mg | ORAL_TABLET | Freq: Four times a day (QID) | ORAL | 1 refills | Status: DC | PRN

## 2018-10-30 MED ORDER — MISOPROSTOL 200 MCG PO TABS
ORAL_TABLET | ORAL | Status: AC
  Filled 2018-10-30: qty 5

## 2018-10-30 MED ORDER — OXYCODONE HCL 5 MG PO TABS
ORAL_TABLET | ORAL | Status: AC
  Filled 2018-10-30: qty 1

## 2018-10-30 MED ORDER — PROMETHAZINE HCL 25 MG/ML IJ SOLN: 6.2500 mg | INTRAMUSCULAR | Status: DC | PRN

## 2018-10-30 MED ORDER — LACTATED RINGERS IV SOLN
INTRAVENOUS | Status: DC
  Administered 2018-10-30: 09:00:00 via INTRAVENOUS

## 2018-10-30 MED ORDER — DOXYCYCLINE HYCLATE 100 MG IV SOLR
200.0000 mg | INTRAVENOUS | Status: AC
  Administered 2018-10-30 (×2): 200 mg via INTRAVENOUS

## 2018-10-30 MED ORDER — PROPOFOL 10 MG/ML IV BOLUS
INTRAVENOUS | Status: DC | PRN
  Administered 2018-10-30: 200 mg via INTRAVENOUS

## 2018-10-30 MED ORDER — SODIUM CHLORIDE 0.9 % IV SOLN
INTRAVENOUS | Status: AC
  Filled 2018-10-30 (×2): qty 100

## 2018-10-30 MED ORDER — SUCCINYLCHOLINE CHLORIDE 200 MG/10ML IV SOSY
PREFILLED_SYRINGE | INTRAVENOUS | Status: AC
  Filled 2018-10-30: qty 10

## 2018-10-30 MED ORDER — EPHEDRINE 5 MG/ML INJ
INTRAVENOUS | Status: AC
  Filled 2018-10-30: qty 10

## 2018-10-30 MED ORDER — KETOROLAC TROMETHAMINE 30 MG/ML IJ SOLN
INTRAMUSCULAR | Status: AC
  Filled 2018-10-30: qty 1

## 2018-10-30 MED ORDER — OXYCODONE HCL 5 MG PO TABS
5.0000 mg | ORAL_TABLET | Freq: Once | ORAL | Status: AC
  Administered 2018-10-30: 5 mg via ORAL

## 2018-10-30 MED ORDER — BUPIVACAINE-EPINEPHRINE 0.5% -1:200000 IJ SOLN
INTRAMUSCULAR | Status: DC | PRN
  Administered 2018-10-30: 7 mL

## 2018-10-30 MED ORDER — LIDOCAINE HCL (CARDIAC) PF 100 MG/5ML IV SOSY
PREFILLED_SYRINGE | INTRAVENOUS | Status: DC | PRN
  Administered 2018-10-30: 50 mg via INTRAVENOUS

## 2018-10-30 MED ORDER — OXYTOCIN 10 UNIT/ML IJ SOLN
INTRAMUSCULAR | Status: AC
  Filled 2018-10-30: qty 2

## 2018-10-30 MED ORDER — MIDAZOLAM HCL 2 MG/2ML IJ SOLN
INTRAMUSCULAR | Status: AC
  Filled 2018-10-30: qty 2

## 2018-10-30 MED ORDER — PHENYLEPHRINE 40 MCG/ML (10ML) SYRINGE FOR IV PUSH (FOR BLOOD PRESSURE SUPPORT)
PREFILLED_SYRINGE | INTRAVENOUS | Status: AC
  Filled 2018-10-30: qty 10

## 2018-10-30 MED ORDER — DEXAMETHASONE SODIUM PHOSPHATE 4 MG/ML IJ SOLN
INTRAMUSCULAR | Status: DC | PRN
  Administered 2018-10-30: 10 mg via INTRAVENOUS

## 2018-10-30 SURGICAL SUPPLY — 24 items
BRIEF STRETCH FOR OB PAD XXL (UNDERPADS AND DIAPERS) ×3 IMPLANT
CATH ROBINSON RED A/P 14FR (CATHETERS) ×3 IMPLANT
DECANTER SPIKE VIAL GLASS SM (MISCELLANEOUS) IMPLANT
FILTER UTR ASPR ASSEMBLY (MISCELLANEOUS) IMPLANT
GLOVE BIO SURGEON STRL SZ 6.5 (GLOVE) ×2 IMPLANT
GLOVE BIO SURGEONS STRL SZ 6.5 (GLOVE) ×1
GLOVE BIOGEL PI IND STRL 7.0 (GLOVE) ×2 IMPLANT
GLOVE BIOGEL PI INDICATOR 7.0 (GLOVE) ×4
GOWN STRL REUS W/ TWL LRG LVL3 (GOWN DISPOSABLE) ×1 IMPLANT
GOWN STRL REUS W/TWL LRG LVL3 (GOWN DISPOSABLE) ×8 IMPLANT
HIBICLENS CHG 4% 4OZ BTL (MISCELLANEOUS) IMPLANT
HOSE CONNECTING 18IN BERKELEY (TUBING) IMPLANT
KIT BERKELEY 1ST TRIMESTER 3/8 (MISCELLANEOUS) ×3 IMPLANT
NS IRRIG 1000ML POUR BTL (IV SOLUTION) ×3 IMPLANT
PACK VAGINAL MINOR WOMEN LF (CUSTOM PROCEDURE TRAY) ×3 IMPLANT
PAD OB MATERNITY 4.3X12.25 (PERSONAL CARE ITEMS) ×3 IMPLANT
PAD PREP 24X48 CUFFED NSTRL (MISCELLANEOUS) ×3 IMPLANT
SET BERKELEY SUCTION TUBING (SUCTIONS) ×3 IMPLANT
TOWEL GREEN STERILE FF (TOWEL DISPOSABLE) ×3 IMPLANT
TRAP TISSUE FILTER (MISCELLANEOUS) IMPLANT
VACURETTE 10 RIGID CVD (CANNULA) ×3 IMPLANT
VACURETTE 7MM CVD STRL WRAP (CANNULA) IMPLANT
VACURETTE 8 RIGID CVD (CANNULA) IMPLANT
VACURETTE 9 RIGID CVD (CANNULA) IMPLANT

## 2018-10-30 NOTE — Anesthesia Procedure Notes (Signed)
Procedure Name: LMA Insertion Date/Time: 10/30/2018 9:56 AM Performed by: Willa Frater, CRNA Pre-anesthesia Checklist: Patient identified, Emergency Drugs available, Suction available and Patient being monitored Patient Re-evaluated:Patient Re-evaluated prior to induction Oxygen Delivery Method: Circle system utilized Preoxygenation: Pre-oxygenation with 100% oxygen Induction Type: IV induction Ventilation: Mask ventilation without difficulty LMA: LMA inserted LMA Size: 4.0 Number of attempts: 1 Airway Equipment and Method: Bite block Placement Confirmation: positive ETCO2 Tube secured with: Tape Dental Injury: Teeth and Oropharynx as per pre-operative assessment

## 2018-10-30 NOTE — Interval H&P Note (Signed)
History and Physical Interval Note:  10/30/2018 9:26 AM  Jill Sexton  has presented today for surgery, with the diagnosis of Missed AB.  The various methods of treatment have been discussed with the patient and family. After consideration of risks, benefits and other options for treatment, the patient has consented to  Procedure(s): DILATATION AND EVACUATION (N/A) as a surgical intervention.  The patient's history has been reviewed, patient examined, no change in status, stable for surgery.  I have reviewed the patient's chart and labs.  Questions were answered to the patient's satisfaction.     Emeterio Reeve

## 2018-10-30 NOTE — Transfer of Care (Signed)
Immediate Anesthesia Transfer of Care Note  Patient: Jill Sexton  Procedure(s) Performed: DILATATION AND EVACUATION (N/A Vagina )  Patient Location: PACU  Anesthesia Type:General  Level of Consciousness: awake, alert , oriented and drowsy  Airway & Oxygen Therapy: Patient Spontanous Breathing and Patient connected to face mask oxygen  Post-op Assessment: Report given to RN and Post -op Vital signs reviewed and stable  Post vital signs: Reviewed and stable  Last Vitals:  Vitals Value Taken Time  BP 121/67 10/30/2018 10:30 AM  Temp    Pulse 109 10/30/2018 10:34 AM  Resp 19 10/30/2018 10:34 AM  SpO2 100 % 10/30/2018 10:34 AM  Vitals shown include unvalidated device data.  Last Pain:  Vitals:   10/30/18 0855  TempSrc: Oral  PainSc: 4       Patients Stated Pain Goal: 2 (45/80/99 8338)  Complications: No apparent anesthesia complications

## 2018-10-30 NOTE — Op Note (Signed)
Jill Sexton PROCEDURE DATE: 10/30/2018  PREOPERATIVE DIAGNOSIS: 10 week missed abortion POSTOPERATIVE DIAGNOSIS: The same PROCEDURE:     Dilation and Evacuation SURGEON:  Emeterio Reeve MD  INDICATIONS: 28 y.o. G2P1001 with MAB at [redacted] weeks gestation, needing surgical completion.  Risks of surgery were discussed with the patient including but not limited to: bleeding which may require transfusion; infection which may require antibiotics; injury to uterus or surrounding organs; need for additional procedures including laparotomy or laparoscopy; possibility of intrauterine scarring which may impair future fertility; and other postoperative/anesthesia complications. Written informed consent was obtained.    FINDINGS:  A 10 week size uterus, moderate amounts of products of conception, specimen sent to pathology.  ANESTHESIA:    Monitored intravenous sedation, paracervical block. INTRAVENOUS FLUIDS:  500 ml of LR ESTIMATED BLOOD LOSS:  Less than 20 ml. SPECIMENS:  Products of conception sent to pathology COMPLICATIONS:  None immediate.  PROCEDURE DETAILS:  The patient received intravenous Doxycycline while in the preoperative area.  She was then taken to the operating room where monitored intravenous sedation was administered and was found to be adequate.  After an adequate timeout was performed, she was placed in the dorsal lithotomy position and examined; then prepped and draped in the sterile manner.   Her bladder was catheterized for an unmeasured amount of clear, yellow urine. A vaginal speculum was then placed in the patient's vagina and a single tooth tenaculum was applied to the anterior lip of the cervix.  A paracervical block using 8 ml of 0.5% Marcaine was administered. The cervix was gently dilated to accommodate a 10 mm suction curette that was gently advanced to the uterine fundus.  The suction device was then activated and curette slowly rotated to clear the uterus of products of conception.  There was minimal bleeding noted and the tenaculum removed with good hemostasis noted.   All instruments were removed from the patient's vagina.  Sponge and instrument counts were correct times two  The patient tolerated the procedure well and was taken to the recovery area awake, and in stable condition.  Woodroe Mode, MD 10/30/2018 10:28 AM

## 2018-10-30 NOTE — Progress Notes (Signed)
Discharge teaching done by M. Kendall Flack, RN

## 2018-10-30 NOTE — Anesthesia Postprocedure Evaluation (Signed)
Anesthesia Post Note  Patient: Dulcy Sida  Procedure(s) Performed: DILATATION AND EVACUATION (N/A Vagina )     Patient location during evaluation: PACU Anesthesia Type: General Level of consciousness: awake and alert Pain management: pain level controlled Vital Signs Assessment: post-procedure vital signs reviewed and stable Respiratory status: spontaneous breathing, nonlabored ventilation, respiratory function stable and patient connected to nasal cannula oxygen Cardiovascular status: blood pressure returned to baseline and stable Postop Assessment: no apparent nausea or vomiting Anesthetic complications: no    Last Vitals:  Vitals:   10/30/18 1100 10/30/18 1115  BP: 130/77 120/75  Pulse: 82 77  Resp: 20 19  Temp:    SpO2: 100% 100%    Last Pain:  Vitals:   10/30/18 1115  TempSrc:   PainSc: 8                  Lilyian Quayle S

## 2018-10-30 NOTE — Anesthesia Preprocedure Evaluation (Signed)
Anesthesia Evaluation  Patient identified by MRN, date of birth, ID band Patient awake    Reviewed: Allergy & Precautions, NPO status , Patient's Chart, lab work & pertinent test results  Airway Mallampati: II  TM Distance: >3 FB Neck ROM: Full    Dental no notable dental hx.    Pulmonary neg pulmonary ROS,    Pulmonary exam normal breath sounds clear to auscultation       Cardiovascular hypertension, Normal cardiovascular exam Rhythm:Regular Rate:Normal     Neuro/Psych negative neurological ROS  negative psych ROS   GI/Hepatic negative GI ROS, Neg liver ROS,   Endo/Other  negative endocrine ROS  Renal/GU negative Renal ROS  negative genitourinary   Musculoskeletal negative musculoskeletal ROS (+)   Abdominal   Peds negative pediatric ROS (+)  Hematology negative hematology ROS (+)   Anesthesia Other Findings   Reproductive/Obstetrics negative OB ROS                             Anesthesia Physical Anesthesia Plan  ASA: II  Anesthesia Plan: General   Post-op Pain Management:    Induction: Intravenous  PONV Risk Score and Plan: 3 and Ondansetron, Dexamethasone, Midazolam and Treatment may vary due to age or medical condition  Airway Management Planned: LMA  Additional Equipment:   Intra-op Plan:   Post-operative Plan: Extubation in OR  Informed Consent: I have reviewed the patients History and Physical, chart, labs and discussed the procedure including the risks, benefits and alternatives for the proposed anesthesia with the patient or authorized representative who has indicated his/her understanding and acceptance.     Dental advisory given  Plan Discussed with: CRNA and Surgeon  Anesthesia Plan Comments:         Anesthesia Quick Evaluation

## 2018-10-30 NOTE — Discharge Instructions (Signed)
Dilatacin y curetaje o curetaje por aspiracin, cuidados posteriores Dilation and Curettage or Vacuum Curettage, Care After Estas indicaciones le proporcionan informacin acerca de cmo deber cuidarse despus del procedimiento. El mdico tambin podr darle indicaciones ms especficas. Comunquese con el mdico si tiene algn problema o tiene preguntas despus del procedimiento. Siga estas indicaciones en su casa: Actividad  No conduzca ni use maquinaria pesada mientras toma analgsicos recetados.  Evite conducir en las primeras 24horas despus del procedimiento.  Haga caminatas breves con frecuencia, seguidas de perodos de descanso. Pregntele al mdico qu actividades son seguras para usted. [place], quizs pueda retomar sus actividades normales.  No levante ningn objeto que pese ms de 10libras (4,5kg) hasta que el mdico lo apruebe.  Por al menos 2semanas o el tiempo que le haya indicado el mdico: ? No se haga duchas vaginales. ? No use tampones. ? No tenga relaciones sexuales. Instrucciones generales   [employer] de venta libre y los recetados solamente como se lo haya indicado el mdico. Esto es muy importante si toma anticoagulantes.  No se d baos de inmersin, no practique natacin ni use el jacuzzi hasta que el mdico lo apruebe. Tome Dentist de baos.  Use medias de compresin como se lo haya indicado el mdico.  Es su responsabilidad retirar los [place] del procedimiento. Pregntele al mdico la fecha en que los resultados estarn disponibles.  Concurra a todas las visitas de control como se lo haya indicado el mdico. Esto es importante. Comunquese con un mdico si:  Tiene clicos muy fuertes que empeoran o no mejoran con los medicamentos.  Tiene dolor muy intenso en el vientre (abdomen).  No puede beber lquidos sin vomitar.  Tiene dolor en otra parte del rea entre el vientre y los muslos (pelvis).  Tiene  secrecin vaginal con mal olor.  Tiene una erupcin cutnea. Solicite ayuda de inmediato si:  Tiene mucho sangrado de la vagina. Mucho sangrado significa empapar ms de una toalla higinica en una hora durante 2horas consecutivas.  Elimina grumos de sangre cogulos de sangre por la vagina.  Tiene fiebre o siente escalofros.  Siente dureza o dolor a Armed forces technical officer.  Siente dolor en el pecho.  Tiene dificultad para respirar.  Tose y escupe sangre.  Siente mareos.  Tiene sensacin de desvanecimiento.  Pierde el conocimiento (se desmaya).  Siente dolor en la zona del cuello o los hombros. Resumen  Haga caminatas breves con frecuencia, seguidas de perodos de descanso. Pregntele al mdico qu actividades son seguras para usted. [place], quizs pueda retomar sus actividades normales.  No levante ningn objeto que pese ms de 10libras (4,5kg) hasta que el mdico lo apruebe.  No se d baos de inmersin, no practique natacin ni use el jacuzzi hasta que el mdico lo apruebe. Tome Dentist de baos.  Comunquese con el mdico si tiene sntomas de infeccin, como secrecin vaginal con mal olor. Esta informacin no tiene Marine scientist el consejo del mdico. Asegrese de hacerle al mdico cualquier pregunta que tenga. Document Released: 02/09/2011 Document Revised: 12/15/2016 Document Reviewed: 01/10/2013 Elsevier Interactive Patient Education  2019 [place] [facility] Instructions  Activity: Get plenty of rest for the remainder of the day. A responsible individual must stay with you for 24 hours following the procedure.  For the next 24 hours, DO NOT: -Drive a car -Paediatric nurse -Drink alcoholic beverages -  Take any medication unless instructed by your physician -Make any legal decisions or sign important papers.  Meals: Start with liquid foods such as gelatin or soup. Progress to regular foods as  tolerated. Avoid greasy, spicy, heavy foods. If nausea and/or vomiting occur, drink only clear liquids until the nausea and/or vomiting subsides. Call your physician if vomiting continues.  Special Instructions/Symptoms: Your throat may feel dry or sore from the anesthesia or the breathing tube placed in your throat during surgery. If this causes discomfort, gargle with warm salt water. The discomfort should disappear within 24 hours.  If you had a scopolamine patch placed behind your ear for the management of post- operative nausea and/or vomiting:  1. The medication in the patch is effective for 72 hours, after which it should be removed.  Wrap patch in a tissue and discard in the trash. Wash hands thoroughly with soap and water. 2. You may remove the patch earlier than 72 hours if you experience unpleasant side effects which may include dry mouth, dizziness or visual disturbances. 3. Avoid touching the patch. Wash your hands with soap and water after contact with the patch.

## 2018-10-31 ENCOUNTER — Encounter (HOSPITAL_BASED_OUTPATIENT_CLINIC_OR_DEPARTMENT_OTHER): Payer: Self-pay | Admitting: Obstetrics & Gynecology

## 2018-10-31 ENCOUNTER — Ambulatory Visit: Payer: No Typology Code available for payment source | Admitting: Obstetrics and Gynecology

## 2018-11-01 ENCOUNTER — Encounter: Payer: No Typology Code available for payment source | Admitting: Obstetrics and Gynecology

## 2018-11-02 LAB — TYPE AND SCREEN
ABO/RH(D): O NEG
Antibody Screen: POSITIVE
Unit division: 0
Unit division: 0

## 2018-11-02 LAB — BPAM RBC
Blood Product Expiration Date: 202005272359
Blood Product Expiration Date: 202005302359
Unit Type and Rh: 9500
Unit Type and Rh: 9500

## 2018-11-07 ENCOUNTER — Encounter: Payer: No Typology Code available for payment source | Admitting: Obstetrics & Gynecology

## 2018-12-05 ENCOUNTER — Ambulatory Visit (INDEPENDENT_AMBULATORY_CARE_PROVIDER_SITE_OTHER): Payer: Self-pay | Admitting: Obstetrics & Gynecology

## 2018-12-05 ENCOUNTER — Other Ambulatory Visit: Payer: Self-pay

## 2018-12-05 DIAGNOSIS — O021 Missed abortion: Secondary | ICD-10-CM

## 2018-12-05 DIAGNOSIS — Z113 Encounter for screening for infections with a predominantly sexual mode of transmission: Secondary | ICD-10-CM

## 2018-12-05 DIAGNOSIS — N898 Other specified noninflammatory disorders of vagina: Secondary | ICD-10-CM

## 2018-12-05 NOTE — Progress Notes (Signed)
Interpreter [phone] used during call.    I connected with  [name] on 12/05/18 at  9:35 AM EDT by telephone and verified that I am speaking with the correct person using two identifiers.   I discussed the limitations, risks, security and privacy concerns of performing an evaluation and management service by telephone and the availability of in person appointments. I also discussed with the patient that there may be a patient responsible charge related to this service. The patient expressed understanding and agreed to proceed.  Dolores Hoose, RN 12/05/2018  9:59 AM

## 2018-12-05 NOTE — Progress Notes (Signed)
Patient ID: Jill Sexton, female   DOB: 1990/08/16, 28 y.o.   MRN: [id number]   TELEHEALTH VIRTUAL GYNECOLOGY VISIT ENCOUNTER NOTE  I connected with [name] on 12/05/18 at  9:35 AM EDT by telephone at home and verified that I am speaking with the correct person using two identifiers.   I discussed the limitations, risks, security and privacy concerns of performing an evaluation and management service by telephone and the availability of in person appointments. I also discussed with the patient that there may be a patient responsible charge related to this service. The patient expressed understanding and agreed to proceed.   History:  Jill Sexton is a 28 y.o. G51P1001 female being evaluated today for postoperative visit after D&C 10/30/18 for missed abortion 10 weeks. She notes abnormal vaginal discharge, no bleeding bleeding, pelvic pain or other concerns.       Past Medical History:  Diagnosis Date  . Gestational hypertension    on amlodipine  . Missed abortion    Past Surgical History:  Procedure Laterality Date  . DILATION AND EVACUATION N/A 10/30/2018   Procedure: DILATATION AND EVACUATION;  Surgeon: Woodroe Mode, MD;  Location: [facility];  Service: Gynecology;  Laterality: N/A;  . NO PAST SURGERIES     The following portions of the patient's history were reviewed and updated as appropriate: allergies, current medications, past family history, past medical history, past social history, past surgical history and problem list.  Interpreter on call via Webex  Review of Systems:  Pertinent items noted in HPI and remainder of comprehensive ROS otherwise negative.  Physical Exam:   General:  Alert, oriented and cooperative.   Mental Status: Normal mood and affect perceived. Normal judgment and thought content.  Physical exam deferred due to nature of the encounter  Labs and Imaging No results found for this or any previous visit (from the past 336 hour(s)). No  results found.    Assessment and Plan:     1. Missed abortion Notes vaginal discharge no pain or bleeding. She scheduled and in person visit       I discussed the assessment and treatment plan with the patient. The patient was provided an opportunity to ask questions and all were answered. The patient agreed with the plan and demonstrated an understanding of the instructions.   The patient was advised to call back or seek an in-person evaluation/go to the ED if the symptoms worsen or if the condition fails to improve as anticipated.  I provided 15 minutes of non-face-to-face time during this encounter.   Emeterio Reeve, MD [facility] [place] [place]

## 2018-12-05 NOTE — Patient Instructions (Addendum)
Aborto espontneo Miscarriage El aborto espontneo es la prdida de un beb que no ha nacido (feto) antes de la [id number] del Media planner. Siga estas indicaciones en su casa: Medicamentos   [employer] de venta libre y los recetados solamente como se lo haya indicado el mdico.  Si le recetaron un antibitico, tmelo como se lo haya indicado el mdico. No deje de tomar los antibiticos aunque comience a Sports administrator.  No tome antiinflamatorios no esteroideos (AINE), a menos que el [employer] diga que son seguros para usted. Estos incluyen aspirina e ibuprofeno. Estos medicamentos pueden provocarle sangrado. Actividad  Haga reposo segn lo indicado. Pregntele al mdico qu actividades son seguras para usted.  Pida ayuda para Optometrist las tareas de la casa durante [place]. Instrucciones generales  Anote cuntos apsitos [place] por da y cun saturados estn.  Observe la cantidad de tejido o grumos de sangre (cogulos de sangre) que expulsa por la vagina. Guarde las cantidades grandes de tejido para llevrselas al mdico.  No use tampones, no se haga duchas vaginales ni tenga relaciones sexuales hasta que el mdico la autorice.  Para que usted y su pareja puedan sobrellevar el proceso de duelo, hable con su mdico o busque apoyo psicolgico.  Cuando est lista, acuda al mdico para hablar [employer] pasos que debe seguir para cuidar su salud. Adems, hable con su mdico [employer] que debe adoptar para tener un embarazo saludable en el futuro.  Concurra a todas las visitas de seguimiento como se lo haya indicado el mdico. Esto es importante. Comunquese con un mdico si:  Tiene fiebre o siente escalofros.  Tiene una secrecin vaginal con mal olor.  Aumenta el sangrado. Solicite ayuda de inmediato si:  Tiene espasmos o dolor muy intensos en el abdomen o en la espalda.  Elimina grumos de sangre por la vagina, que tienen el tamao de una nuez o ms.  Elimina  tejido por la vagina, que tiene el tamao de una nuez o ms.  Empapa ms de un apsito de tamao normal por hora.  Se siente dbil o mareada.  Pierde el conocimiento (se desmaya).  Siente tristeza que no se va o piensa en lastimarse. Resumen  El aborto espontneo es la prdida de un beb que no ha nacido antes de la [id number] del [place].  Siga las indicaciones de su mdico para el cuidado Systems developer. Concurra a todas las visitas de control.  Para que usted y su pareja puedan sobrellevar el proceso de duelo, hable con su mdico o busque apoyo psicolgico. Esta informacin no tiene Marine scientist el consejo del mdico. Asegrese de hacerle al mdico cualquier pregunta que tenga. Document Released: 12/13/2011 Document Revised: 03/20/2017 Document Reviewed: 03/20/2017 Elsevier Interactive Patient Education  2019 [employer].

## 2018-12-06 ENCOUNTER — Ambulatory Visit (INDEPENDENT_AMBULATORY_CARE_PROVIDER_SITE_OTHER): Payer: Self-pay

## 2018-12-06 DIAGNOSIS — N898 Other specified noninflammatory disorders of vagina: Secondary | ICD-10-CM

## 2018-12-06 NOTE — Progress Notes (Signed)
Pt here today for vaginal swab due to vaginal discharge.  Pt denies any odor just that it is a lot.  Pt explained on how to obtain self swab.  That it takes approximately 24-48 hrs to results in which we will call with abnormal results.  Pt verbalized understanding.   Mel Almond, RN 12/06/18

## 2018-12-07 LAB — CERVICOVAGINAL ANCILLARY ONLY
Bacterial vaginitis: NEGATIVE
Candida vaginitis: NEGATIVE
Chlamydia: NEGATIVE
Neisseria Gonorrhea: NEGATIVE
Trichomonas: NEGATIVE

## 2018-12-10 NOTE — Progress Notes (Signed)
I have reviewed this chart and agree with the RN/CMA assessment and management.    Joahan Swatzell C Kaelene Elliston, MD, FACOG Attending Physician, [facility]  

## 2019-03-19 ENCOUNTER — Ambulatory Visit (INDEPENDENT_AMBULATORY_CARE_PROVIDER_SITE_OTHER): Payer: Self-pay | Admitting: *Deleted

## 2019-03-19 ENCOUNTER — Other Ambulatory Visit: Payer: Self-pay

## 2019-03-19 ENCOUNTER — Encounter: Payer: Self-pay | Admitting: *Deleted

## 2019-03-19 DIAGNOSIS — I10 Essential (primary) hypertension: Secondary | ICD-10-CM | POA: Insufficient documentation

## 2019-03-19 DIAGNOSIS — Z789 Other specified health status: Secondary | ICD-10-CM

## 2019-03-19 DIAGNOSIS — O10919 Unspecified pre-existing hypertension complicating pregnancy, unspecified trimester: Secondary | ICD-10-CM

## 2019-03-19 DIAGNOSIS — O0990 Supervision of high risk pregnancy, unspecified, unspecified trimester: Secondary | ICD-10-CM

## 2019-03-19 DIAGNOSIS — Z603 Acculturation difficulty: Secondary | ICD-10-CM

## 2019-03-19 NOTE — Progress Notes (Signed)
I connected with  Jill Sexton on 03/19/19 at  9:30 AM EDT by telephone and verified that I am speaking with the correct person using two identifiers.   I discussed the limitations, risks, security and privacy concerns of performing an evaluation and management service by telephone and the availability of in person appointments. I also discussed with the patient that there may be a patient responsible charge related to this service. The patient expressed understanding and agreed to proceed.  Explained I am completing her New OB Intake today. We discussed Her EDD and that it is based on  sure LMP. She is transferred from [place] and I have not received her records yet.  I reviewed her allergies, meds, OB History, Medical /Surgical history, and appropriate screenings. Explained we will give her a blood pressure cuff to her since she is self pay when she comes in to the office and  we will show her how to use it. Explained  then we will have her take her blood pressure weekly and enter into the app. Explained she will have some visits in office and some virtually. I Reviewed her appointment date/ time with her , our location and to wear mask, no visitors. Explained she will have exam, ob bloodwork, hemoglobin a1C, cbg , genetic testing if desired, pap if needed. Explained we will schedule an [place] at 19 weeks. She voices understanding.   Clarity Ciszek,RN 03/19/2019  9:32 AM

## 2019-03-19 NOTE — Progress Notes (Signed)
Addendum: I called [facility] and asked for records. new ob intake notes were faxed ; they will also fax pap smear from 09/2018 and cystic fibroisis results.          Rakan Soffer,RN

## 2019-03-20 NOTE — Progress Notes (Signed)
Patient seen and assessed by nursing staff during this encounter. I have reviewed the chart and agree with the documentation and plan.  Verita Schneiders, MD 03/20/2019 3:52 PM

## 2019-03-25 ENCOUNTER — Telehealth: Payer: Self-pay | Admitting: Obstetrics & Gynecology

## 2019-03-25 NOTE — Telephone Encounter (Signed)
Spanish interpreter Gypsy Balsam talked to about her appointment on 9/29 @ 8:35. Patient instructed to wear a face mask for the entire appointment and no visitors are allowed. Patient screened for covid symptoms and denied having any.

## 2019-03-26 ENCOUNTER — Encounter: Payer: Self-pay | Admitting: Obstetrics & Gynecology

## 2019-03-26 ENCOUNTER — Ambulatory Visit (INDEPENDENT_AMBULATORY_CARE_PROVIDER_SITE_OTHER): Payer: Self-pay | Admitting: Obstetrics & Gynecology

## 2019-03-26 ENCOUNTER — Other Ambulatory Visit: Payer: Self-pay

## 2019-03-26 VITALS — BP 137/89 | HR 66 | Wt 167.4 lb

## 2019-03-26 DIAGNOSIS — O0990 Supervision of high risk pregnancy, unspecified, unspecified trimester: Secondary | ICD-10-CM

## 2019-03-26 DIAGNOSIS — O0991 Supervision of high risk pregnancy, unspecified, first trimester: Secondary | ICD-10-CM

## 2019-03-26 DIAGNOSIS — Z6791 Unspecified blood type, Rh negative: Secondary | ICD-10-CM | POA: Insufficient documentation

## 2019-03-26 DIAGNOSIS — O10911 Unspecified pre-existing hypertension complicating pregnancy, first trimester: Secondary | ICD-10-CM

## 2019-03-26 DIAGNOSIS — O26891 Other specified pregnancy related conditions, first trimester: Secondary | ICD-10-CM

## 2019-03-26 DIAGNOSIS — O10919 Unspecified pre-existing hypertension complicating pregnancy, unspecified trimester: Secondary | ICD-10-CM

## 2019-03-26 DIAGNOSIS — Z3A10 10 weeks gestation of pregnancy: Secondary | ICD-10-CM

## 2019-03-26 DIAGNOSIS — Z113 Encounter for screening for infections with a predominantly sexual mode of transmission: Secondary | ICD-10-CM

## 2019-03-26 DIAGNOSIS — O26899 Other specified pregnancy related conditions, unspecified trimester: Secondary | ICD-10-CM | POA: Insufficient documentation

## 2019-03-26 LAB — GLUCOSE, CAPILLARY: Glucose-Capillary: 89 mg/dL (ref 70–99)

## 2019-03-26 MED ORDER — ASPIRIN EC 81 MG PO TBEC: 81.0000 mg | DELAYED_RELEASE_TABLET | Freq: Every day | ORAL | 2 refills | Status: DC

## 2019-03-26 MED ORDER — AMLODIPINE BESYLATE 5 MG PO TABS: 5.0000 mg | ORAL_TABLET | Freq: Every day | ORAL | 6 refills | Status: DC

## 2019-03-26 NOTE — Progress Notes (Signed)
Pt provided BP cuff in office. Instructions given to patient on how to use and document in [employer]. Pt provided return demonstration and when to report blood pressures.

## 2019-03-26 NOTE — Patient Instructions (Addendum)
Hipertensin durante el embarazo Hypertension During Pregnancy La presin arterial alta (hipertensin) se produce cuando la fuerza de la sangre bombea a travs de las arterias con mucha fuerza. Las arterias son vasos sanguneos que transportan la sangre desde el corazn al resto del cuerpo. La hipertensin durante el embarazo puede ser leve o grave. La hipertensin grave durante el embarazo (preeclampsia) es una emergencia mdica que requiere evaluacin y tratamiento con prontitud. Durante el embarazo se pueden presentar diferentes tipos de hipertensin. Estos incluyen los siguientes:  Hipertensin crnica. Esto sucede cuando se ha tenido presin arterial alta antes de quedar embarazada y contina durante el [place]. La hipertensin que aparece antes de las 20 semanas de [place] y contina durante el embarazo tambin se denomina hipertensin crnica. Si tiene hipertensin crnica, no desaparecer despus de haber tenido el beb. Deber concurrir a visitas de seguimiento con el mdico despus de haber tenido el beb. El mdico podr indicarle que siga recibiendo medicamentos para la presin arterial.  Hipertensin gestacional. Esta hipertensin se desarrolla despus de la semana 20de embarazo. La hipertensin gestacional por lo general desaparece despus de tener el beb, pero el mdico deber controlar la presin arterial para asegurarse de que est mejorando.  Preeclampsia. Se produce cuando se desarrolla hipertensin grave durante el embarazo. Esto puede causar complicaciones graves para usted y el beb, y tambin puede causarle complicaciones despus del parto.  Preeclampsia posparto. Usted puede desarrollar hipertensin grave despus de dar a luz. Esto normalmente ocurre dentro de las 48 horas despus del nacimiento del beb, pero puede aparecer hasta 6 semanas despus de dar a luz. Esto es poco frecuente. Lowry Ram modo me afecta? Las mujeres que sufren de hipertensin durante el embarazo tienen  una mayor probabilidad de Actor hipertensin en [place] posteriores de la vida o en embarazos futuros. En algunos casos, la hipertensin durante el embarazo puede causar complicaciones graves, como:  Accidente cerebrovascular.  Infarto de miocardio.  Lesiones en otros rganos, como riones, pulmones o hgado.  Preeclampsia.  Convulsiones.  Desprendimiento de la placenta. Cmo afecta esto al beb? La hipertensin durante el embarazo puede afectar al beb. El beb puede:  Nacer en forma temprana (prematuramente).  No pesar tanto como debera al nacer (bajo peso al nacer).  No tolerar bien el trabajo de [place], lo que deriva en un parto por cesrea no planeado. Cules son los riesgos? Existen ciertos factores que [employer] las probabilidades de que desarrolle hipertensin durante el [place]. Estos incluyen los siguientes:  Haber sufrido hipertensin arterial durante un embarazo anterior.  Tener sobrepeso.  Tener 35aos o ms.  Estar embarazada por primera vez.  Estar embarazada de ms de un beb.  Embarazarse a travs de mtodos de fertilizacin como FIV (fertilizacin in vitro).  Tener otros problemas mdicos, como diabetes, enfermedad renal o lupus.  Tener antecedentes familiares de hipertensin. Qu puedo hacer para disminuir el riesgo? Se desconoce la causa exacta de la hipertensin durante el embarazo. Es posible que pueda reducir su riesgo al hacer lo siguiente:  Theatre manager un peso saludable.  Consumir una dieta saludable y equilibrada.  Seguir las indicaciones del mdico acerca de Public relations account executive afeccin a largo plazo que haya tenido antes de quedar embarazada. Es muy importante que concurra a todas las visitas de cuidado prenatal. El mdico Psychologist, educational la presin arterial y se asegurar de que el embarazo est progresando segn lo previsto. Si se detecta un problema, el tratamiento temprano puede evitar complicaciones. Cmo se trata? El tratamiento  para la hipertensin durante el  embarazo difiere segn el tipo de hipertensin que tiene y de su gravedad.  Si estuvo recibiendo medicamentos para la presin arterial alta antes de quedar embarazada, hable con el mdico. Es posible que deba cambiar los medicamentos durante el embarazo porque algunos medicamentos, como los inhibidores de la enzima convertidora de angiotensina ([id number]), pueden no ser considerados seguros para el beb.  Si tiene hipertensin gestacional, el mdico puede indicarle medicamentos para tratar Surveyor, quantity.  Si est en riesgo de padecer preeclampsia, su mdico puede recomendarle que tome una dosis baja de aspirina durante el [place].  Si tiene hipertensin grave, es posible que deba hospitalizarse para que usted y el beb puedan ser controlados atentamente. Es posible que deba recibir un medicamento para disminuir la presin arterial. Fish farm manager se puede administrar por boca o a travs de una va intravenosa.  En ciertos casos, si su afeccin empeora, es posible que necesite dar a luz de forma ms temprana. Siga estas instrucciones en su casa: Comida y bebida   Beba suficiente lquido como para Theatre manager la orina de color amarillo plido.  Evite la cafena. Estilo de vida  No consuma ningn producto que contenga nicotina o tabaco, como cigarrillos, cigarrillos electrnicos y tabaco de Higher education careers adviser. Si necesita ayuda para dejar de fumar, consulte al mdico.  No consuma drogas ni alcohol.  Evite las situaciones estresantes tanto como sea posible.  Haga reposo y [place].  El ejercicio regular puede ayudar a reducir la presin arterial. Consulte al mdico qu tipos de ejercicios son seguros para usted. Indicaciones generales  Use los medicamentos de venta libre y los recetados solamente como se lo haya indicado el mdico.  Concurra a todas las visitas de control prenatales y de seguimiento como se lo haya indicado el mdico. Esto es  importante. Comunquese con un mdico si:  Tiene sntomas que su mdico le ha informado que pueden requerir ms tratamiento o control, por ejemplo: ? Dolores de [place]. ? Nuseas o vmitos. ? Dolor abdominal. ? Mareos. ? Desvanecimiento. Solicite ayuda inmediatamente si:  Tiene lo siguiente: ? Dolor abdominal intenso que no mejora con el tratamiento. ? Dolor de cabeza intenso que no mejora. ? Vmitos que no mejoran. ? Aumento de peso repentino y rpido. ? Hinchazn repentina en las manos, los tobillos o el rostro. ? Sangrado vaginal. ? Presenta sangre en la orina. ? Visin borrosa o doble. ? Siente que le falta el aire o dolor en el pecho. ? Debilidad en un lado del cuerpo. ? Dificultad para hablar.  El beb no se mueve tanto como sera usual. Resumen  La presin arterial alta (hipertensin) se produce cuando la fuerza de la sangre bombea a travs de las arterias con mucha fuerza.  La hipertensin durante el embarazo puede causar problemas para usted y el beb.  El tratamiento para la hipertensin durante el embarazo difiere segn el tipo de hipertensin que tiene y de su gravedad.  Concurra a todas las visitas de control prenatales y de seguimiento como se lo haya indicado el mdico. Esto es importante. Esta informacin no tiene Marine scientist el consejo del mdico. Asegrese de hacerle al mdico cualquier pregunta que tenga. Document Released: 06/02/2011 Document Revised: 08/10/2018 Document Reviewed: 08/10/2018 Elsevier Patient Education  [place] trimestre de Media planner First Trimester of Pregnancy El primer trimestre de Media planner se extiende desde la semana1 hasta el final de la semana13 (mes1 al mes3). Una semana despus de que un espermatozoide fecunda un vulo, este se implantar  en la pared uterina. Este embrin comenzar a Medical laboratory scientific officer convertirse en un beb. Sus genes y los de su pareja forman el beb. Los genes del varn determinan si  ser un nio o una nia. Entre la semana6 y [place], se forman los ojos y Financial risk analyst, y los latidos del corazn pueden verse en una ecografa. Al final de las 12semanas, todos los rganos del beb estn formados. Ahora que est embarazada, querr hacer todo lo que est a su alcance para tener un beb sano. Dos de las cosas ms importantes son Raynelle Jan un buen cuidado prenatal y seguir las indicaciones del mdico. El cuidado prenatal incluye toda la asistencia mdica que usted recibe antes del nacimiento del beb. Esto ayudar a Chemical engineer, Hydrographic surveyor y tratar cualquier problema durante el embarazo y [place]. Durante Software engineer trimestre, ocurren cambios en el cuerpo. Su organismo atraviesa por muchos cambios durante el [place]. Estos cambios varan de [place] a [place].  Al principio, puede aumentar o bajar algunos kilos.  Puede tener Higher education careers adviser (nuseas) y vomitar. Si no puede controlar los vmitos, llame al mdico.  Puede cansarse con facilidad.  Es posible que tenga dolores de cabeza que pueden aliviarse con ciertos medicamentos. Todos los [place] que tome deben estar aprobados por el mdico.  Puede orinar con mayor frecuencia. El dolor al orinar puede significar que usted tiene una infeccin de la vejiga.  Debido al [place], puede tener acidez estomacal.  Puede estar estreida, ya que ciertas hormonas enlentecen los movimientos de los msculos que empujan las heces a travs del intestino.  Pueden aparecer hemorroides o hincharse las venas (venas varicosas).  Las [employer] pueden empezar a Engineer, site y Scientist, forensic. Los pezones pueden sobresalir ms, y el tejido que los rodea (arola) tornarse ms oscuro.  Las Production manager y estar sensibles al cepillado y al hilo dental.  Pueden aparecer zonas oscuras o manchas (cloasma, mscara del [place]) en el rostro. Esto probablemente se atenuar despus del nacimiento del beb.  Los perodos menstruales se interrumpirn.  Tal vez  no tenga apetito.  Puede sentir un fuerte deseo de consumir ciertos alimentos.  Puede tener cambios a Engineer, site a da, por ejemplo, por momentos puede estar emocionada por el Media planner y por otros preocuparse porque algo pueda salir mal con el embarazo o el beb.  Tendr sueos ms vvidos y extraos.  Tal vez haya cambios en el cabello. Esto cambios pueden incluir su engrosamiento, crecimiento rpido y [employer] textura. Adems, a algunas mujeres se les cae el cabello durante o despus del embarazo, o tienen el cabello seco o fino. Lo ms probable es que el cabello se le normalice despus del nacimiento del beb. Qu debe esperar en las visitas prenatales Durante una visita prenatal de rutina:  La pesarn para asegurarse de que usted y el beb estn creciendo normalmente.  Le tomarn la presin arterial.  Le medirn el abdomen para controlar el desarrollo del beb.  Se escucharn los latidos cardacos fetales entre las semanas10 y14 de [place].  Se analizarn los resultados de los estudios solicitados en visitas anteriores. El mdico puede preguntarle lo siguiente:  Cmo se siente.  Si siente los movimientos del beb.  Si ha tenido sntomas anormales, como prdida de lquido, [place], dolores de cabeza intensos o clicos abdominales.  Si est consumiendo algn producto que contenga tabaco, como cigarrillos, tabaco de Higher education careers adviser y Psychologist, sport and exercise.  Si tiene [employer]. Otros estudios que pueden realizarse durante Software engineer trimestre incluyen  lo siguiente:  Anlisis de sangre para determinar el grupo sanguneo y Hydrographic surveyor la presencia de infecciones previas. Las pruebas tambin se utilizarn para Teacher, adult education si tiene bajo nivel de hierro (anemia) y protenas en los glbulos rojos (anticuerpos Rh). En funcin de sus factores de riesgo, o si ya tuvo diabetes durante un embarazo anterior, le pueden hacer pruebas para determinar si tiene un nivel alto de azcar  en la sangre, algo que puede afectar a embarazadas (diabetes gestacional).  Anlisis de orina para detectar infecciones, diabetes o protenas en la orina.  Una ecografa para confirmar que el beb crece y se desarrolla correctamente.  Estudios fetales para Hydrographic surveyor problemas de la mdula espinal (espina bfida) y sndrome de [place].  Prueba del VIH (virus de inmunodeficiencia humana). Los exmenes prenatales de rutina incluyen la prueba de deteccin del VIH, a menos que decida no Radiation protection practitioner.  Es posible que necesite otras pruebas adicionales. Siga estas instrucciones en su casa: [place] indicaciones del mdico en relacin con el uso de medicamentos. Durante el embarazo, hay medicamentos que pueden tomarse y 61 que no.  Tome vitaminas prenatales que contengan por lo menos 123456 (?g) de cido flico.  Si est estreida, tome un laxante suave, si el mdico lo autoriza. Comida y bebida   Gwenevere Ghazi dieta equilibrada que incluya gran cantidad de frutas y verduras frescas, cereales integrales, buenas fuentes de protenas como carnes [place], huevos o tofu, y lcteos descremados. El mdico la ayudar a Office manager cantidad de peso que puede [place].  No coma carne cruda ni quesos sin cocinar. Estos elementos contienen grmenes que pueden causar defectos congnitos en el beb.  La ingesta diaria de cuatro o cinco comidas pequeas en lugar de tres comidas abundantes puede ayudar a [employer] nuseas y los vmitos. Si empieza a tener nuseas, comer algunas galletas saladas puede ser de [place]. Beber lquidos [employer], Engineer, technical sales de tomarlos durante las comidas, tambin puede ayudar a Public house manager las nuseas y los vmitos.  Limite el consumo de alimentos con alto contenido de grasas y azcares procesados, como alimentos fritos o dulces.  Para evitar el estreimiento: ? Consuma alimentos ricos en fibra, como frutas y verduras frescas, cereales integrales y  frijoles. ? Beba suficiente lquido para mantener la orina clara o de color amarillo plido. Actividad  Haga ejercicio solamente como se lo haya indicado el mdico. La mayora de las mujeres pueden continuar su rutina de ejercicios durante el [place]. Intente realizar como mnimo 65minutos de actividad fsica por lo menos 5das a la semana. El ejercicio la ayudar a: ? Engineer, technical sales. ? Mantenerse en forma. ? Estar preparada para el trabajo de parto y [place].  Los dolores, los clicos en la parte baja del abdomen o los calambres en la cintura son un buen indicio de que debe dejar de Insurance risk surveyor. Consulte al mdico antes de seguir haciendo ejercicios con normalidad.  Intente no estar de pie [employer]. Mueva las piernas con frecuencia si debe estar de pie en un lugar durante mucho tiempo.  Evite levantar pesos [employer].  Use zapatos de tacones bajos y [place].  Puede seguir teniendo [employer], salvo que el mdico le indique lo contrario. Alivio del dolor y del Tree surgeon  Use un sostn que le brinde buen soporte para Best boy de [place].  Dese baos de asiento con agua tibia para Best boy o las molestias causadas por las hemorroides. Use una crema para las  hemorroides si el mdico la autoriza.  Descanse con las piernas elevadas si tiene calambres o dolor de cintura.  Si tiene venas varicosas en las piernas, use medias de descanso. Eleve los pies durante 84minutos, 3 o 4veces por da. Limite el consumo de sal en su dieta. Cuidados prenatales  Programe las visitas prenatales para la semana12 de [place]. Generalmente se programan cada mes al principio y se hacen ms frecuentes en los 2 ltimos meses antes del parto.  Escriba sus preguntas. Llvelas cuando concurra a las visitas prenatales.  Concurra a todas las visitas prenatales tal como se lo haya indicado el mdico. Esto es importante. Seguridad  Use el cinturn  de seguridad en todo momento mientras conduce.  Haga una lista de los nmeros de telfono de Freight forwarder, que [employer]J's nmeros de telfono de familiares, [place], el hospital y los departamentos de polica y bomberos. Instrucciones generales  Pdale al mdico que la derive a clases de educacin prenatal en su localidad. Debe comenzar a tomar las clases antes de que empiece el mes6 de [place].  Pida ayuda si tiene necesidades nutricionales o de asesoramiento Solicitor. El mdico puede aconsejarla o derivarla a especialistas para que la ayuden con diferentes necesidades.  No se d baos de inmersin en agua caliente, baos turcos ni saunas.  No se haga duchas vaginales ni use tampones o toallas higinicas perfumadas.  No mantenga las piernas cruzadas durante mucho tiempo.  Evite el contacto con las bandejas sanitarias de los gatos y la tierra que estos animales usan. Estos elementos contienen bacterias que pueden causar defectos congnitos al beb y la posible prdida del feto debido a un aborto espontneo o muerte fetal.  No fume, no consuma hierbas ni medicamentos que no hayan sido recetados por el mdico. Las sustancias qumicas que estos productos contienen afectan la formacin y el desarrollo del beb.  No consuma ningn producto que contenga nicotina o tabaco, como cigarrillos y Psychologist, sport and exercise. Si necesita ayuda para dejar de fumar, consulte al [employer]. Puede recibir asesoramiento y otro tipo de recursos para dejar de fumar.  Programe una cita con el dentista. En su casa, lvese los dientes con un cepillo dental blando y psese el hilo dental con suavidad. Comunquese con un mdico si:  Tiene mareos.  Siente clicos leves, presin en la pelvis o dolor persistente en el abdomen.  Tiene nuseas, vmitos o diarrea persistentes.  Margette Fast secrecin vaginal con mal olor.  Siente dolor al [employer].  Observa ms hinchazn en la cara, las manos, las piernas o los  tobillos.  Est expuesta a la quinta enfermedad o a la varicela.  Est expuesta al sarampin alemn (rubola) y nunca lo haba tenido. Solicite ayuda de inmediato si:  Tiene fiebre.  Tiene una prdida de lquido por la vagina.  Tiene sangrado o pequeas prdidas vaginales.  Siente dolor intenso o clicos en el abdomen.  Sube o baja de peso rpidamente.  Vomita sangre de color rojo brillante o una sustancia similar a los granos de caf.  Dolor de cabeza intenso.  Le falta el aire.  Sufre cualquier tipo de traumatismo, por ejemplo, debido a una cada o un accidente automovilstico. Resumen  El primer trimestre de Media planner se extiende desde la semana1 hasta el final de la semana13 (mes1 al mes3).  Su organismo atraviesa por muchos cambios durante el [place]. Estos cambios varan de [place] a [place].  Tendr visitas prenatales de rutina. Durante esas visitas, el mdico la examinar, hablar con usted  acerca de los resultados de sus pruebas y Warehouse manager cmo se siente. Esta informacin no tiene Marine scientist el consejo del mdico. Asegrese de hacerle al mdico cualquier pregunta que tenga. Document Released: 03/23/2005 Document Revised: 09/16/2016 Document Reviewed: 09/16/2016 Elsevier Patient Education  2020 [employer].

## 2019-03-26 NOTE — Progress Notes (Signed)
History:   Jill Sexton is a 28 y.o. G3P1011 at 100w4d by LMP being seen today for her first obstetrical visit. Patient is Spanish-speaking only, Spanish interpreter present for this encounter. Her obstetrical history is significant for [facility] for which she was on Lisinopril then Amlodipine before pregnancy. Changed to Labetalol a few weels ago, she does not like this as it causes her headaches.  Patient also had a recent MAB at 10 weeks in 10/2018, had a D&E. Patient does intend to breast feed. Pregnancy history fully reviewed.  Patient reports no complaints.      HISTORY: OB History  Gravida Para Term Preterm AB Living  3 1 1  0 1 1  SAB TAB Ectopic Multiple Live Births  1 0 0 0 1    # Outcome Date GA Lbr Len/2nd Weight Sex Delivery Anes PTL Lv  3 Current           2 SAB 10/2018 [redacted]w[redacted]d    SAB        Birth Comments: D&E for MAB 10/30/18  1 Term 07/17/15 [redacted]w[redacted]d / 00:17 5 lb 14.2 oz (2.67 kg) F Vag-Spont None  LIV     Birth Comments: no complications     Name: Tabor,GIRL Rosabella     Apgar1: 8  Apgar5: 9    Last pap smear was done 09/2018 at [facility] and was normal  Past Medical History:  Diagnosis Date  . Chronic hypertension    on amlodipine  . Missed abortion    Past Surgical History:  Procedure Laterality Date  . DILATION AND EVACUATION N/A 10/30/2018   Procedure: DILATATION AND EVACUATION;  Surgeon: Woodroe Mode, MD;  Location: [place];  Service: Gynecology;  Laterality: N/A;  . NO PAST SURGERIES     Family History  Problem Relation Age of Onset  . Hypertension Mother    Social History   Tobacco Use  . Smoking status: Never Smoker  . Smokeless tobacco: Never Used  Substance Use Topics  . Alcohol use: No  . Drug use: No   No Known Allergies Current Outpatient Medications on File Prior to Visit  Medication Sig Dispense Refill  . labetalol (NORMODYNE) 100 MG tablet TK 1 T PO QD    . Prenatal Vit-Fe Fumarate-FA (PRENATAL VITAMINS PO) Take 1 tablet by mouth  daily.     No current facility-administered medications on file prior to visit.     Review of Systems Pertinent items noted in HPI and remainder of comprehensive ROS otherwise negative. Physical Exam:   Vitals:   03/26/19 0939  BP: 137/89  Pulse: 66  Weight: 167 lb 6.4 oz (75.9 kg)   Fetal Heart Rate (bpm): 169 on bedside ultrasound. Patient informed that the ultrasound is considered a limited obstetric ultrasound and is not intended to be a complete ultrasound exam.  Patient also informed that the ultrasound is not being completed with the intent of assessing for fetal or placental anomalies or any pelvic abnormalities.  Explained that the purpose of today's ultrasound is to assess for fetal heart rate.  Patient acknowledges the purpose of the exam and the limitations of the study.    General: well-developed, well-nourished female in no acute distress  Breasts:  normal appearance, no masses or tenderness bilaterally  Skin: normal coloration and turgor, no rashes  Neurologic: oriented, normal, negative, normal mood  Extremities: normal strength, tone, and muscle mass, ROM of all joints is normal  HEENT PERRLA, extraocular movement intact and sclera clear,  anicteric  Mouth/Teeth mucous membranes moist, pharynx normal without lesions and dental hygiene good  Neck supple and no masses  Cardiovascular: regular rate and rhythm  Respiratory:  no respiratory distress, normal breath sounds  Abdomen: soft, non-tender; bowel sounds normal; no masses,  no organomegaly  Pelvic: deferred    Assessment:    Pregnancy: G3P1011 Patient Active Problem List   Diagnosis Date Noted  . Rh negative, antepartum 03/26/2019  . Supervision of high risk pregnancy, antepartum 03/19/2019  . Chronic hypertension in pregnancy 03/19/2019  . Language barrier 03/19/2019     Plan:    1. Chronic hypertension in pregnancy Switched medication to Amlodipine as she has tolerated this in the past and also due to  cost ($10 at [facility]).  ASA prescribed for PEC prophylaxis.   Discussed need for close BP monitoring, serial scans, antenatal testing in third trimester and early delivery. All questions answered.  - [place] MFM OB DETAIL +14 WK; Future - amLODipine (NORVASC) 5 MG tablet; Take 1 tablet (5 mg total) by mouth daily.  Dispense: 30 tablet; Refill: 6 - aspirin EC 81 MG tablet; Take 1 tablet (81 mg total) by mouth daily. Take after 12 weeks for prevention of preeclampsia later in pregnancy  Dispense: 300 tablet; Refill: 2  2. Rh negative, antepartum Will get Rhogam at 28 weeks.   3. Supervision of high risk pregnancy, antepartum - Culture, OB Urine - GC/Chlamydia probe amp ([place])not at [facility] - Obstetric Panel, Including HIV - Comprehensive metabolic panel - Protein / creatinine ratio, urine - TSH - Hemoglobin A1c - Genetic Screening - US MFM OB DETAIL +14 WK; Future  Initial labs drawn. Continue prenatal vitamins. Genetic Screening discussed, NIPS: ordered. Ultrasound discussed; fetal anatomic survey: ordered. Problem list reviewed and updated. The nature of [place] with multiple MDs and other Advanced Practice Providers was explained to patient; also emphasized that residents, students are part of our team. Discussed that some visits will be virtual due to current pandemic, she will be given instructions on how to participate in these visits. Routine obstetric precautions reviewed. Return in about 4 weeks (around 04/23/2019) for Virtual [facility] Visit (Spanish interpreter)  8-9 weeks from now: OFFICE [facility] Visit and MFM scan.     Verita Schneiders, MD, [place] for [employer], [place]

## 2019-03-27 LAB — OBSTETRIC PANEL, INCLUDING HIV
Antibody Screen: NEGATIVE
Basophils Absolute: 0 10*3/uL (ref 0.0–0.2)
Basos: 0 %
EOS (ABSOLUTE): 0.1 10*3/uL (ref 0.0–0.4)
Eos: 1 %
HIV Screen 4th Generation wRfx: NONREACTIVE
Hematocrit: 35.5 % (ref 34.0–46.6)
Hemoglobin: 12.1 g/dL (ref 11.1–15.9)
Hepatitis B Surface Ag: NEGATIVE
Immature Grans (Abs): 0 10*3/uL (ref 0.0–0.1)
Immature Granulocytes: 0 %
Lymphocytes Absolute: 2.1 10*3/uL (ref 0.7–3.1)
Lymphs: 24 %
MCH: 29.2 pg (ref 26.6–33.0)
MCHC: 34.1 g/dL (ref 31.5–35.7)
MCV: 86 fL (ref 79–97)
Monocytes Absolute: 0.5 10*3/uL (ref 0.1–0.9)
Monocytes: 6 %
Neutrophils Absolute: 6.2 10*3/uL (ref 1.4–7.0)
Neutrophils: 69 %
Platelets: 257 10*3/uL (ref 150–450)
RBC: 4.15 x10E6/uL (ref 3.77–5.28)
RDW: 13.2 % (ref 11.7–15.4)
RPR Ser Ql: NONREACTIVE
Rh Factor: NEGATIVE
Rubella Antibodies, IGG: 13.6 index (ref >0.99)
WBC: 9.1 10*3/uL (ref 3.4–10.8)

## 2019-03-27 LAB — COMPREHENSIVE METABOLIC PANEL
ALT: 11 IU/L (ref 0–32)
AST: 12 IU/L (ref 0–40)
Albumin/Globulin Ratio: 1.6 (ref 1.2–2.2)
Albumin: 4.2 g/dL (ref 3.9–5.0)
Alkaline Phosphatase: 62 IU/L (ref 39–117)
BUN/Creatinine Ratio: 18 (ref 9–23)
BUN: 9 mg/dL (ref 6–20)
Bilirubin Total: <0.2 mg/dL (ref 0.0–1.2)
CO2: 20 mmol/L (ref 20–29)
Calcium: 9.1 mg/dL (ref 8.7–10.2)
Chloride: 104 mmol/L (ref 96–106)
Creatinine, Ser: 0.5 mg/dL — ABNORMAL LOW (ref 0.57–1.00)
GFR calc Af Amer: 154 mL/min/{1.73_m2} (ref >59)
GFR calc non Af Amer: 134 mL/min/{1.73_m2} (ref >59)
Globulin, Total: 2.7 g/dL (ref 1.5–4.5)
Glucose: 85 mg/dL (ref 65–99)
Potassium: 3.9 mmol/L (ref 3.5–5.2)
Sodium: 136 mmol/L (ref 134–144)
Total Protein: 6.9 g/dL (ref 6.0–8.5)

## 2019-03-27 LAB — GC/CHLAMYDIA PROBE AMP (~~LOC~~) NOT AT ARMC
Chlamydia: NEGATIVE
Molecular Disclaimer: NEGATIVE
Molecular Disclaimer: NORMAL
Neisseria Gonorrhea: NEGATIVE

## 2019-03-27 LAB — HEMOGLOBIN A1C
Est. average glucose Bld gHb Est-mCnc: 105 mg/dL
Hgb A1c MFr Bld: 5.3 % (ref 4.8–5.6)

## 2019-03-27 LAB — TSH: TSH: 0.981 u[IU]/mL (ref 0.450–4.500)

## 2019-03-27 LAB — PROTEIN / CREATININE RATIO, URINE
Creatinine, Urine: 275.9 mg/dL
Protein, Ur: 18 mg/dL
Protein/Creat Ratio: 65 mg/g creat (ref 0–200)

## 2019-03-28 LAB — CULTURE, OB URINE

## 2019-03-28 LAB — URINE CULTURE, OB REFLEX

## 2019-04-15 ENCOUNTER — Encounter: Payer: Self-pay | Admitting: *Deleted

## 2019-04-23 ENCOUNTER — Encounter: Payer: Self-pay | Admitting: Obstetrics & Gynecology

## 2019-04-23 ENCOUNTER — Ambulatory Visit (INDEPENDENT_AMBULATORY_CARE_PROVIDER_SITE_OTHER): Payer: Self-pay | Admitting: Obstetrics & Gynecology

## 2019-04-23 ENCOUNTER — Encounter: Payer: Self-pay | Admitting: General Practice

## 2019-04-23 ENCOUNTER — Other Ambulatory Visit: Payer: Self-pay

## 2019-04-23 DIAGNOSIS — Z6791 Unspecified blood type, Rh negative: Secondary | ICD-10-CM

## 2019-04-23 DIAGNOSIS — O26899 Other specified pregnancy related conditions, unspecified trimester: Secondary | ICD-10-CM

## 2019-04-23 DIAGNOSIS — Z789 Other specified health status: Secondary | ICD-10-CM

## 2019-04-23 DIAGNOSIS — O10919 Unspecified pre-existing hypertension complicating pregnancy, unspecified trimester: Secondary | ICD-10-CM

## 2019-04-23 DIAGNOSIS — O0990 Supervision of high risk pregnancy, unspecified, unspecified trimester: Secondary | ICD-10-CM

## 2019-04-23 DIAGNOSIS — O26892 Other specified pregnancy related conditions, second trimester: Secondary | ICD-10-CM

## 2019-04-23 DIAGNOSIS — O10912 Unspecified pre-existing hypertension complicating pregnancy, second trimester: Secondary | ICD-10-CM

## 2019-04-23 DIAGNOSIS — O0992 Supervision of high risk pregnancy, unspecified, second trimester: Secondary | ICD-10-CM

## 2019-04-23 DIAGNOSIS — Z3A14 14 weeks gestation of pregnancy: Secondary | ICD-10-CM

## 2019-04-23 NOTE — Progress Notes (Signed)
   TELEHEALTH VIRTUAL OBSTETRICS VISIT ENCOUNTER NOTE  I connected with Jill Sexton on 04/23/19 at  9:35 AM EDT by telephone at home and verified that I am speaking with the correct person using two identifiers.   I discussed the limitations, risks, security and privacy concerns of performing an evaluation and management service by telephone and the availability of in person appointments. I also discussed with the patient that there may be a patient responsible charge related to this service. The patient expressed understanding and agreed to proceed.  Subjective:  Jill Sexton is a 28 y.o. G3P1011 at [redacted]w[redacted]d being followed for ongoing prenatal care.  She is currently monitored for the following issues for this low-risk pregnancy and has Supervision of high risk pregnancy, antepartum; Chronic hypertension in pregnancy; Language barrier; and Rh negative, antepartum on their problem list.  Patient reports nausea. Reports fetal movement. Denies any contractions, bleeding or leaking of fluid.   The following portions of the patient's history were reviewed and updated as appropriate: allergies, current medications, past family history, past medical history, past social history, past surgical history and problem list.   Objective:   General:  Alert, oriented and cooperative.   Mental Status: Normal mood and affect perceived. Normal judgment and thought content.  Rest of physical exam deferred due to type of encounter  Assessment and Plan:  Pregnancy: G3P1011 at [redacted]w[redacted]d 1. Supervision of high risk pregnancy, antepartum Has appt on 11/24 needs labs a that appt- NIPS and AFP Also has anatomy scan that same day   2. Rh negative, antepartum Needs Rhogam at 28 weeks   3. Language barrier Spanish interpreter used for entire visit  4. Chronic hypertension in pregnancy Taking baby ASA Taking BP but, cannot get on to [employer].  120/81 all the results have been this value or lower.     Preterm  labor symptoms and general obstetric precautions including but not limited to vaginal bleeding, contractions, leaking of fluid and fetal movement were reviewed in detail with the patient.  I discussed the assessment and treatment plan with the patient. The patient was provided an opportunity to ask questions and all were answered. The patient agreed with the plan and demonstrated an understanding of the instructions. The patient was advised to call back or seek an in-person office evaluation/go to MAU at [facility] for any urgent or concerning symptoms. Please refer to After Visit Summary for other counseling recommendations.   I provided 11 minutes of non-face-to-face time during this encounter.  No follow-ups on file.  Future Appointments  Date Time Provider [place]  05/21/2019  8:55 AM Woodroe Mode, MD WOC-WOCA [place]  05/21/2019 10:00 AM [place] NURSE [place] MFC-US  05/21/2019 10:00 AM WH-MFC [place] 3 WH-MFCUS MFC-US    Lavonia Drafts, MD [facility], [place]

## 2019-04-23 NOTE — Progress Notes (Signed)
I connected with  Jill Sexton on 04/23/19 at  9:35 AM EDT by telephone using pacific interpreters id [phone] and verified that I am speaking with the correct person using two identifiers.   I discussed the limitations, risks, security and privacy concerns of performing an evaluation and management service by telephone and the availability of in person appointments. I also discussed with the patient that there may be a patient responsible charge related to this service. The patient expressed understanding and agreed to proceed.  Loma Sousa, RN

## 2019-04-24 ENCOUNTER — Encounter: Payer: Self-pay | Admitting: *Deleted

## 2019-05-21 ENCOUNTER — Encounter (HOSPITAL_COMMUNITY): Payer: Self-pay

## 2019-05-21 ENCOUNTER — Ambulatory Visit (INDEPENDENT_AMBULATORY_CARE_PROVIDER_SITE_OTHER): Payer: Self-pay | Admitting: Obstetrics & Gynecology

## 2019-05-21 ENCOUNTER — Other Ambulatory Visit (HOSPITAL_COMMUNITY): Payer: Self-pay | Admitting: *Deleted

## 2019-05-21 ENCOUNTER — Ambulatory Visit (HOSPITAL_COMMUNITY)
Admission: RE | Admit: 2019-05-21 | Discharge: 2019-05-21 | Disposition: A | Discharge status: Home / Self Care | Payer: Self-pay | Source: Ambulatory Visit | Attending: Obstetrics & Gynecology | Admitting: Obstetrics & Gynecology

## 2019-05-21 ENCOUNTER — Ambulatory Visit (HOSPITAL_COMMUNITY): Payer: Self-pay | Admitting: *Deleted

## 2019-05-21 ENCOUNTER — Other Ambulatory Visit: Payer: Self-pay

## 2019-05-21 VITALS — BP 131/85 | HR 87 | Wt 172.1 lb

## 2019-05-21 DIAGNOSIS — O0990 Supervision of high risk pregnancy, unspecified, unspecified trimester: Secondary | ICD-10-CM

## 2019-05-21 DIAGNOSIS — O10919 Unspecified pre-existing hypertension complicating pregnancy, unspecified trimester: Secondary | ICD-10-CM

## 2019-05-21 DIAGNOSIS — Z789 Other specified health status: Secondary | ICD-10-CM

## 2019-05-21 DIAGNOSIS — Z3A18 18 weeks gestation of pregnancy: Secondary | ICD-10-CM

## 2019-05-21 DIAGNOSIS — O10012 Pre-existing essential hypertension complicating pregnancy, second trimester: Secondary | ICD-10-CM

## 2019-05-21 DIAGNOSIS — O26899 Other specified pregnancy related conditions, unspecified trimester: Secondary | ICD-10-CM | POA: Insufficient documentation

## 2019-05-21 DIAGNOSIS — Z6791 Unspecified blood type, Rh negative: Secondary | ICD-10-CM

## 2019-05-21 DIAGNOSIS — O10912 Unspecified pre-existing hypertension complicating pregnancy, second trimester: Secondary | ICD-10-CM

## 2019-05-21 DIAGNOSIS — O360120 Maternal care for anti-D [Rh] antibodies, second trimester, not applicable or unspecified: Secondary | ICD-10-CM

## 2019-05-21 DIAGNOSIS — O99212 Obesity complicating pregnancy, second trimester: Secondary | ICD-10-CM

## 2019-05-21 DIAGNOSIS — O0992 Supervision of high risk pregnancy, unspecified, second trimester: Secondary | ICD-10-CM

## 2019-05-21 MED ORDER — ASPIRIN EC 81 MG PO TBEC: 81.0000 mg | DELAYED_RELEASE_TABLET | Freq: Every day | ORAL | 2 refills | Status: DC

## 2019-05-21 NOTE — Patient Instructions (Addendum)
Segundo trimestre de embarazo Second Trimester of Pregnancy  El segundo trimestre va desde la semana14 hasta la 27 (desde el mes 4 hasta el 6). Este suele ser el momento en el que mejor se siente. En general, las nuseas matutinas han disminuido o han desaparecido completamente. Tendr ms energa y podr aumentarle el apetito. El beb en gestacin se desarrolla rpidamente. Hacia el final del sexto mes, el beb mide aproximadamente 9 pulgadas (23 cm) y pesa alrededor de 1 libras (700 g). Es probable que sienta al beb moverse entre las 18 y 20 semanas del embarazo. Siga estas indicaciones en su casa: Medicamentos  Tome los medicamentos de venta libre y los recetados solamente como se lo haya indicado el mdico. Algunos medicamentos son seguros para tomar durante el embarazo y otros no lo son.  Tome vitaminas prenatales que contengan por lo menos 600microgramos (?g) de cido flico.  Si tiene dificultad para mover el intestino (estreimiento), tome un medicamento para ablandar las heces (laxante) si su mdico se lo autoriza. Comida y bebida   Ingiera alimentos saludables de manera regular.  No coma carne cruda ni quesos sin cocinar.  Si obtiene poca cantidad de calcio de los alimentos que ingiere, consulte a su mdico sobre la posibilidad de tomar un suplemento diario de calcio.  Evite el consumo de alimentos ricos en grasas y azcares, como los alimentos fritos y los dulces.  Si tiene malestar estomacal (nuseas) o devuelve (vomita): ? Ingiera 4 o 5comidas pequeas por da en lugar de 3abundantes. ? Intente comer algunas galletitas saladas. ? Beba lquidos entre las comidas, en lugar de hacerlo durante estas.  Para evitar el estreimiento: ? Consuma alimentos ricos en fibra, como frutas y verduras frescas, cereales integrales y frijoles. ? Beba suficiente lquido para mantener el pis (orina) claro o de color amarillo plido. Actividad  Haga ejercicios solamente como se lo haya  indicado el mdico. Interrumpa la actividad fsica si comienza a tener calambres.  No haga ejercicio si hace demasiado calor, hay demasiada humedad o se encuentra en un lugar de mucha altura (altitud alta).  Evite levantar pesos excesivos.  Use zapatos con tacones bajos. Mantenga una buena postura al sentarse y pararse.  Puede continuar teniendo relaciones sexuales, a menos que el mdico le indique lo contrario. Alivio del dolor y del malestar  Use un sostn que le brinde buen soporte si sus mamas estn sensibles.  Dese baos de asiento con agua tibia para aliviar el dolor o las molestias causadas por las hemorroides. Use una crema para las hemorroides si el mdico la autoriza.  Descanse con las piernas elevadas si tiene calambres o dolor de cintura.  Si desarrolla venas hinchadas y abultadas (vrices) en las piernas: ? Use medias de compresin o medias de descanso como se lo haya indicado el mdico. ? Levante (eleve) los pies durante 15minutos, 3 o 4veces por da. ? Limite el consumo de sal en sus alimentos. Cuidado prenatal  Escriba sus preguntas. Llvelas cuando concurra a las visitas prenatales.  Concurra a todas las visitas prenatales como se lo haya indicado el mdico. Esto es importante. Seguridad  Colquese el cinturn de seguridad cuando conduzca.  Haga una lista de los nmeros de telfono de emergencia, que incluya los nmeros de telfono de familiares, amigos, el hospital, as como los departamentos de polica y bomberos. Instrucciones generales  Consulte a su mdico sobre los alimentos que debe comer o pdale que la ayude a encontrar a quien pueda aconsejarla si necesita ese servicio.    Consulte a su mdico acerca de dnde se dictan clases prenatales cerca de donde vive. Comience las clases antes del mes 6 de embarazo.  No se d baos de inmersin en agua caliente, baos turcos ni saunas.  No se haga duchas vaginales ni use tampones o toallas higinicas perfumadas.   No mantenga las piernas cruzadas durante mucho tiempo.  Vaya al dentista si an no lo hizo. Use un cepillo de cerdas suaves para cepillarse los dientes. Psese el hilo dental suavemente.  No fume, no consuma hierbas ni beba alcohol. No tome frmacos que el mdico no haya autorizado.  No consuma ningn producto que contenga nicotina o tabaco, como cigarrillos y Psychologist, sport and exercise. Si necesita ayuda para dejar de fumar, consulte al mdico.  Evite el contacto con las bandejas sanitarias de los gatos y la tierra que estos animales usan. Estos elementos contienen bacterias que pueden causar defectos congnitos al beb y la posible prdida del beb (aborto espontneo) o la muerte fetal. Comunquese con un mdico si:  Tiene clicos leves o siente presin en la parte baja del vientre.  Tiene dolor al hacer pis (orinar).  Advierte un lquido con olor ftido que proviene de la vagina.  Tiene Higher education careers adviser (nuseas), devuelve (vomita) o tiene deposiciones acuosas (diarrea).  Sufre un dolor persistente en el abdomen.  Siente mareos. Solicite ayuda de inmediato si:  Tiene fiebre.  Tiene una prdida de lquido por la vagina.  Tiene sangrado o pequeas prdidas vaginales.  Siente dolor intenso o clicos en el abdomen.  Sube o baja de peso rpidamente.  Tiene dificultades para recuperar el aliento y siente dolor en el pecho.  Sbitamente se le hinchan mucho el rostro, las [place], los tobillos, los pies o las piernas.  No ha sentido los movimientos del beb durante [employer].  Siente un dolor de cabeza intenso que no se alivia al tomar [employer].  Tiene dificultad para ver. Resumen  El segundo trimestre va desde la semana14 hasta la 31, desde el mes 4 hasta el 6. Este suele ser el momento en el que mejor se siente.  Para cuidarse y cuidar a su beb en gestacin, debe comer alimentos saludables, tomar medicamentos solamente si su mdico le indica que lo haga y hacer  actividades que sean seguras para usted y su beb.  Llame al mdico si se enferma o si nota algo inusual acerca de su embarazo. Tambin llame al mdico si necesita ayuda para saber qu alimentos debe comer o si quiere saber qu actividades puede realizar de forma segura. Esta informacin no tiene Marine scientist el consejo del mdico. Asegrese de hacerle al mdico cualquier pregunta que tenga. Document Released: 02/13/2013 Document Revised: 03/08/2017 Document Reviewed: 03/08/2017 Elsevier Patient Education  2020 [place] de espalda durante el embarazo Back Pain in Pregnancy El dolor de espalda es habitual durante el [place]. Puede deberse a varios factores relacionados con los cambios durante esta etapa. Siga estas indicaciones en su casa: Control del dolor, el entumecimiento y la hinchazn      Si se lo indican, para el dolor de espalda repentino (agudo), aplique hielo en la zona dolorida. ? Ponga el hielo en una bolsa plstica. ? Coloque una [employer] piel y Therapist, nutritional. ? Coloque el hielo durante 69minutos, 2a3veces al da.  Si se lo indican, aplique calor en la zona afectada antes de realizar ejercicios. Use la fuente de calor que el mdico le recomiende, como una compresa de calor hmedo o [place]  almohadilla trmica. ? Coloque una [employer] piel y la fuente de Freight forwarder. ? Aplique calor durante 20 a 66minutos. ? Retire la fuente de calor si la piel se pone de color rojo brillante. Esto es especialmente importante si no puede sentir dolor, calor o fro. Puede correr un riesgo mayor de sufrir quemaduras.  Si se lo indican, aplique un masaje en la zona afectada. Actividad  Haga ejercicio como se lo haya indicado el mdico. Hacer actividad fsica suave es la mejor forma de evitar o controlar el dolor de espalda.  Prstele atencin a su cuerpo cuando se levante. Si siente dolor al levantarse, pida ayuda o flexione las rodillas. [employer], se usan los  msculos de las piernas en lugar de los de la espalda.  Pngase en cuclillas al levantar algo del suelo. No se agache.  Haga reposo en cama nicamente por perodos breves como se lo haya indicado el mdico. El reposo en cama solo debe hacerse cuando los episodios de dolor de espalda son ms intensos. Pararse, sentarse y acostarse  No permanezca sentada o de pie en el mismo lugar durante largos perodos.  Cuando est sentada, adopte una Patent examiner. Asegrese de que su cabeza descanse sobre sus hombros y no est colgando hacia delante. Use una almohada en la parte inferior de la espalda si es necesario.  Trate de dormir de lado, de preferencia del lado izquierdo, con una almohada de sostn para embarazadas o 1 o 2 almohadas comunes entre las piernas. ? Si tiene Social research officer, government de espalda despus de una noche de descanso, la cama puede ser [employer]. ? Un colchn duro puede brindarle ms apoyo para la Doctor, general practice. Indicaciones generales  No use zapatos con tacones altos.  Siga una dieta saludable. Trate de aumentar de peso dentro de las recomendaciones del mdico.  Use una faja de maternidad, un arns elstico o un cors para la espalda como se lo haya indicado el mdico.  Tome los medicamentos de venta libre y los recetados solamente como se lo haya indicado el mdico.  Animal nutritionist con un fisioterapeuta o un masajista para Licensed conveyancer de Financial controller de espalda. La acupuntura o la terapia de masajes pueden ser tiles.  Concurra a todas las visitas de control como se lo haya indicado el mdico. Esto es importante. Comunquese con un mdico si:  El dolor de Wellsite geologist impide [employer] cotidianas.  Aumenta el dolor en otras partes del cuerpo. Solicite ayuda inmediatamente si:  Siente entumecimiento, hormigueo, debilidad o problemas con el uso de los brazos o las piernas.  Siente un dolor de espalda intenso que no puede controlar con los  medicamentos.  Presenta modificaciones en el control de la vejiga o el intestino.  Siente que le falta el aire, se marea o se desmaya.  Tiene nuseas, vmitos o sudoracin.  Siente un dolor de espalda que es rtmico y de tipo clico, similar a las contracciones del [place]. Las contracciones del parto suelen aparecer cada 1 a 36minutos, duran aproximadamente 24minuto y estn acompaadas de una sensacin de empujar o de presin en la pelvis.  Tiene dolor de espalda y rompe la bolsa de las aguas o tiene sangrado vaginal.  El dolor o el adormecimiento se extienden hacia la pierna.  El dolor aparece despus de una cada.  Siente dolor de un solo lado.  Observa sangre en la orina.  Le aparecen ampollas en la piel en la zona del dolor de espalda. Resumen  Puede deberse a varios factores relacionados con los cambios durante esta etapa.  Siga las indicaciones del mdico para Financial controller, la rigidez y la hinchazn.  Haga ejercicio como se lo haya indicado el mdico. Hacer actividad fsica suave es la mejor forma de evitar o controlar el dolor de espalda.  Tome los medicamentos de venta libre y los recetados solamente como se lo haya indicado el mdico.  Consulting civil engineer a todas las visitas de control como se lo haya indicado el mdico. Esto es importante. Esta informacin no tiene Marine scientist el consejo del mdico. Asegrese de hacerle al mdico cualquier pregunta que tenga. Document Released: 02/23/2011 Document Revised: 01/22/2018 Document Reviewed: 01/22/2018 Elsevier Patient Education  2020 [employer].

## 2019-05-21 NOTE — Progress Notes (Signed)
   PRENATAL VISIT NOTE  Subjective:  Jill Sexton is a 28 y.o. G3P1011 at [redacted]w[redacted]d being seen today for ongoing prenatal care.  She is currently monitored for the following issues for this high-risk pregnancy and has Supervision of high risk pregnancy, antepartum; Chronic hypertension in pregnancy; Language barrier; and Rh negative, antepartum on their problem list.  Patient reports backache.  Contractions: Not present. Vag. Bleeding: None.  Movement: Present. Denies leaking of fluid.   The following portions of the patient's history were reviewed and updated as appropriate: allergies, current medications, past family history, past medical history, past social history, past surgical history and problem list.   Objective:   Vitals:   05/21/19 0905  BP: 131/85  Pulse: 87  Weight: 172 lb 1.6 oz (78.1 kg)    Fetal Status: Fetal Heart Rate (bpm): 147   Movement: Present     General:  Alert, oriented and cooperative. Patient is in no acute distress.  Skin: Skin is warm and dry. No rash noted.   Cardiovascular: Normal heart rate noted  Respiratory: Normal respiratory effort, no problems with respiration noted  Abdomen: Soft, gravid, appropriate for gestational age.  Pain/Pressure: Absent     Pelvic: Cervical exam deferred        Extremities: Normal range of motion.  Edema: None  Mental Status: Normal mood and affect. Normal behavior. Normal judgment and thought content.   Assessment and Plan:  Pregnancy: G3P1011 at [redacted]w[redacted]d 1. Chronic hypertension in pregnancy BP in acceptable range, needs to start her ASA  2. Supervision of high risk pregnancy, antepartum [place] today, declined genetics  Preterm labor symptoms and general obstetric precautions including but not limited to vaginal bleeding, contractions, leaking of fluid and fetal movement were reviewed in detail with the patient. Please refer to After Visit Summary for other counseling recommendations.   Return in about 4 weeks (around  06/18/2019).  Future Appointments  Date Time Provider [place]  05/21/2019 10:00 AM [place] MFC-US  05/21/2019 10:00 AM WH-MFC [place] 3 WH-MFCUS MFC-US    Emeterio Reeve, MD

## 2019-06-18 ENCOUNTER — Other Ambulatory Visit: Payer: Self-pay

## 2019-06-18 ENCOUNTER — Ambulatory Visit: Payer: Self-pay | Admitting: Obstetrics & Gynecology

## 2019-06-18 ENCOUNTER — Encounter: Payer: Self-pay | Admitting: Obstetrics & Gynecology

## 2019-06-18 DIAGNOSIS — O10919 Unspecified pre-existing hypertension complicating pregnancy, unspecified trimester: Secondary | ICD-10-CM

## 2019-06-18 DIAGNOSIS — Z789 Other specified health status: Secondary | ICD-10-CM

## 2019-06-18 DIAGNOSIS — O0990 Supervision of high risk pregnancy, unspecified, unspecified trimester: Secondary | ICD-10-CM

## 2019-06-18 DIAGNOSIS — O99210 Obesity complicating pregnancy, unspecified trimester: Secondary | ICD-10-CM

## 2019-06-18 NOTE — Progress Notes (Signed)
Attempted to call pt at 0911 with interpreter Eda. Unable to leave a voicemail.    Attempted to contact pt a second time with [facility] interpreter [place] ID [phone]. VM left stating this was second attempt to contact pt and she will need to reschedule with the office. Call back number left. Called patient contact Yolande Jolly (spouse) at [phone]. Same VM left.   Apolonio Schneiders RN 06/18/19

## 2019-06-18 NOTE — Progress Notes (Signed)
dkna

## 2019-06-28 NOTE — L&D Delivery Note (Signed)
OB/GYN Faculty Practice Delivery Note  Jill Sexton is a 29 y.o. G3P1011 s/p VD at [redacted]w[redacted]d. She was admitted for SOL/SROM.   ROM: 3h 53m with clear fluid GBS Status: Negative/-- (03/29 0948) Maximum Maternal Temperature: 98.46F  Labor Progress: . Initial SVE: 4/40/-2. SROM occurred in MAU and patient was admitted for expectant management. She then progressed to complete.   Delivery Date/Time: 4/10 @ [id number] Delivery: Called to room and patient was complete and pushing. Head delivered in ROP position. No nuchal cord present. Shoulder and body delivered in usual fashion. Infant with spontaneous cry, placed on mother's abdomen, dried and stimulated. Cord clamped x 2 after 1-minute delay, and cut by FOB. Cord blood drawn. Placenta delivered spontaneously with gentle cord traction. Fundus firm with massage and Pitocin. Labia, perineum, vagina, and cervix inspected inspected with small first degree vaginal which was hemostatic and patient declined repair after shared-decision making.  Baby Weight: pending  Placenta: Sent to L&D Complications: None Lacerations: first degree vaginal EBL: 150 mL Analgesia: None  Infant:  APGAR (1 MIN): 8   APGAR (5 MINS): 8   APGAR (10 MINS):     Barrington Ellison, MD [facility] Fellow, [facility] for [facility], [place] [facility] 10/05/2019, 4:33 AM

## 2019-07-02 ENCOUNTER — Ambulatory Visit (HOSPITAL_COMMUNITY): Payer: Self-pay

## 2019-07-02 ENCOUNTER — Encounter (HOSPITAL_COMMUNITY): Payer: Self-pay

## 2019-07-24 ENCOUNTER — Other Ambulatory Visit: Payer: Self-pay

## 2019-07-24 ENCOUNTER — Encounter: Payer: Self-pay | Admitting: Obstetrics and Gynecology

## 2019-07-24 ENCOUNTER — Ambulatory Visit (INDEPENDENT_AMBULATORY_CARE_PROVIDER_SITE_OTHER): Payer: Self-pay | Admitting: Obstetrics and Gynecology

## 2019-07-24 VITALS — BP 150/85 | HR 88 | Wt 180.0 lb

## 2019-07-24 DIAGNOSIS — Z789 Other specified health status: Secondary | ICD-10-CM

## 2019-07-24 DIAGNOSIS — Z3A27 27 weeks gestation of pregnancy: Secondary | ICD-10-CM

## 2019-07-24 DIAGNOSIS — O360920 Maternal care for other rhesus isoimmunization, second trimester, not applicable or unspecified: Secondary | ICD-10-CM

## 2019-07-24 DIAGNOSIS — Z113 Encounter for screening for infections with a predominantly sexual mode of transmission: Secondary | ICD-10-CM

## 2019-07-24 DIAGNOSIS — N898 Other specified noninflammatory disorders of vagina: Secondary | ICD-10-CM

## 2019-07-24 DIAGNOSIS — Z6791 Unspecified blood type, Rh negative: Secondary | ICD-10-CM

## 2019-07-24 DIAGNOSIS — Z603 Acculturation difficulty: Secondary | ICD-10-CM

## 2019-07-24 DIAGNOSIS — O10919 Unspecified pre-existing hypertension complicating pregnancy, unspecified trimester: Secondary | ICD-10-CM

## 2019-07-24 DIAGNOSIS — O0990 Supervision of high risk pregnancy, unspecified, unspecified trimester: Secondary | ICD-10-CM

## 2019-07-24 MED ORDER — RHO D IMMUNE GLOBULIN 1500 UNIT/2ML IJ SOSY
300.0000 ug | PREFILLED_SYRINGE | Freq: Once | INTRAMUSCULAR | Status: AC
  Administered 2019-07-24: 300 ug via INTRAMUSCULAR

## 2019-07-24 MED ORDER — VITAFOL ULTRA 29-0.6-0.4-200 MG PO CAPS: 1.0000 | ORAL_CAPSULE | Freq: Every day | ORAL | 12 refills | Status: DC

## 2019-07-24 NOTE — Progress Notes (Signed)
Takes norvasc at night Yellow discharge for about a week to a week and a half sometimes a odor to it

## 2019-07-24 NOTE — Addendum Note (Signed)
Addended by: Alric Seton on: 07/24/2019 11:03 AM   Modules accepted: Orders

## 2019-07-24 NOTE — Addendum Note (Signed)
Addended by: Mora Bellman on: 07/24/2019 11:08 AM   Modules accepted: Orders

## 2019-07-24 NOTE — Progress Notes (Signed)
   PRENATAL VISIT NOTE  Subjective:  Jill Sexton is a 29 y.o. G3P1011 at [redacted]w[redacted]d being seen today for ongoing prenatal care.  She is currently monitored for the following issues for this high-risk pregnancy and has Supervision of high risk pregnancy, antepartum; Chronic hypertension in pregnancy; Language barrier; and Rh negative, antepartum on their problem list.  Patient reports the presence of a vaginal discharge with odor.   .  .   . Denies leaking of fluid.   The following portions of the patient's history were reviewed and updated as appropriate: allergies, current medications, past family history, past medical history, past social history, past surgical history and problem list.   Objective:  There were no vitals filed for this visit.  Fetal Status:           General:  Alert, oriented and cooperative. Patient is in no acute distress.  Skin: Skin is warm and dry. No rash noted.   Cardiovascular: Normal heart rate noted  Respiratory: Normal respiratory effort, no problems with respiration noted  Abdomen: Soft, gravid, appropriate for gestational age.        Pelvic: Cervical exam deferred        Extremities: Normal range of motion.     Mental Status: Normal mood and affect. Normal behavior. Normal judgment and thought content.   Assessment and Plan:  Pregnancy: G3P1011 at [redacted]w[redacted]d 1. Supervision of high risk pregnancy, antepartum Patient is doing well without complaints Patient will be scheduled for third trimester labs with glucola prior to her next virtual visit Vaginal swab today Patient plans nexplanon for contraception  2. Chronic hypertension in pregnancy Continue norvasc and ASA  3. Rh negative, antepartum Rhogam today  4. Language barrier Spanish interpreter present for the encounter  Preterm labor symptoms and general obstetric precautions including but not limited to vaginal bleeding, contractions, leaking of fluid and fetal movement were reviewed in detail with the  patient. Please refer to After Visit Summary for other counseling recommendations.   Return in about 2 weeks (around 08/07/2019) for Virtual, ROB, High risk.  No future appointments.  Mora Bellman, MD

## 2019-07-25 LAB — CERVICOVAGINAL ANCILLARY ONLY
Bacterial Vaginitis (gardnerella): NEGATIVE
Candida Glabrata: NEGATIVE
Candida Vaginitis: NEGATIVE
Chlamydia: NEGATIVE
Comment: NEGATIVE
Comment: NEGATIVE
Comment: NEGATIVE
Comment: NEGATIVE
Comment: NEGATIVE
Comment: NORMAL
Neisseria Gonorrhea: NEGATIVE
Trichomonas: NEGATIVE

## 2019-07-30 ENCOUNTER — Other Ambulatory Visit: Payer: Self-pay | Admitting: *Deleted

## 2019-07-30 DIAGNOSIS — O0990 Supervision of high risk pregnancy, unspecified, unspecified trimester: Secondary | ICD-10-CM

## 2019-08-01 ENCOUNTER — Other Ambulatory Visit: Payer: Self-pay

## 2019-08-01 DIAGNOSIS — O0990 Supervision of high risk pregnancy, unspecified, unspecified trimester: Secondary | ICD-10-CM

## 2019-08-02 LAB — CBC
Hematocrit: 33.9 % — ABNORMAL LOW (ref 34.0–46.6)
Hemoglobin: 11.2 g/dL (ref 11.1–15.9)
MCH: 28.4 pg (ref 26.6–33.0)
MCHC: 33 g/dL (ref 31.5–35.7)
MCV: 86 fL (ref 79–97)
Platelets: 211 10*3/uL (ref 150–450)
RBC: 3.95 x10E6/uL (ref 3.77–5.28)
RDW: 12.7 % (ref 11.7–15.4)
WBC: 10.2 10*3/uL (ref 3.4–10.8)

## 2019-08-02 LAB — GLUCOSE TOLERANCE, 2 HOURS W/ 1HR
Glucose, 1 hour: 177 mg/dL (ref 65–179)
Glucose, 2 hour: 113 mg/dL (ref 65–152)
Glucose, Fasting: 87 mg/dL (ref 65–91)

## 2019-08-02 LAB — HIV ANTIBODY (ROUTINE TESTING W REFLEX): HIV Screen 4th Generation wRfx: NONREACTIVE

## 2019-08-02 LAB — RPR: RPR Ser Ql: NONREACTIVE

## 2019-08-07 ENCOUNTER — Telehealth (INDEPENDENT_AMBULATORY_CARE_PROVIDER_SITE_OTHER): Payer: Self-pay | Admitting: Obstetrics & Gynecology

## 2019-08-07 DIAGNOSIS — O10919 Unspecified pre-existing hypertension complicating pregnancy, unspecified trimester: Secondary | ICD-10-CM

## 2019-08-07 DIAGNOSIS — O0990 Supervision of high risk pregnancy, unspecified, unspecified trimester: Secondary | ICD-10-CM

## 2019-08-07 DIAGNOSIS — O10913 Unspecified pre-existing hypertension complicating pregnancy, third trimester: Secondary | ICD-10-CM

## 2019-08-07 DIAGNOSIS — Z3A29 29 weeks gestation of pregnancy: Secondary | ICD-10-CM

## 2019-08-07 NOTE — Progress Notes (Signed)
   TELEHEALTH OBSTETRICS PRENATAL VIRTUAL VIDEO VISIT ENCOUNTER NOTE  Provider location: [facility] for [facility] at [place]   I connected with [name] on 08/07/19 at 10:35 AM EST by MyChart Video Encounter at home and verified that I am speaking with the correct person using two identifiers.   I discussed the limitations, risks, security and privacy concerns of performing an evaluation and management service virtually and the availability of in person appointments. I also discussed with the patient that there may be a patient responsible charge related to this service. The patient expressed understanding and agreed to proceed. Subjective:  Jill Sexton is a 29 y.o. G3P1011 at [redacted]w[redacted]d being seen today for ongoing prenatal care. Patient is Spanish-speaking only, Spanish interpreter present for this encounter. She is currently monitored for the following issues for this high-risk pregnancy and has Supervision of high risk pregnancy, antepartum; Chronic hypertension in pregnancy; Language barrier; and Rh negative, antepartum on their problem list.  Patient reports no complaints.  Contractions: Not present. Vag. Bleeding: None.  Movement: Present. Denies any leaking of fluid.   The following portions of the patient's history were reviewed and updated as appropriate: allergies, current medications, past family history, past medical history, past social history, past surgical history and problem list.   Objective:   Vitals:   08/07/19 1049  BP: 126/83  Pulse: 82    Fetal Status:     Movement: Present     General:  Alert, oriented and cooperative. Patient is in no acute distress.  Respiratory: Normal respiratory effort, no problems with respiration noted  Mental Status: Normal mood and affect. Normal behavior. Normal judgment and thought content.  Rest of physical exam deferred due to type of encounter  Imaging: No results found.  Assessment and Plan:  Pregnancy: G3P1011 at [redacted]w[redacted]d 1.  Chronic hypertension in pregnancy Stable BP on Norvasc.  Continue ASA. Growth scan ordered (patient missed a few appointments, emphasized importance of keeping appointments). Will start weekly antenatal testing at 32 weeks. - [place] MFM OB FOLLOW UP; Future  2. Supervision of high risk pregnancy, antepartum Preterm labor symptoms and general obstetric precautions including but not limited to vaginal bleeding, contractions, leaking of fluid and fetal movement were reviewed in detail with the patient. I discussed the assessment and treatment plan with the patient. The patient was provided an opportunity to ask questions and all were answered. The patient agreed with the plan and demonstrated an understanding of the instructions. The patient was advised to call back or seek an in-person office evaluation/go to MAU at [facility] for any urgent or concerning symptoms. Please refer to After Visit Summary for other counseling recommendations.   I provided 10 minutes of face-to-face time during this encounter.  Return in about 19 days (around 08/26/2019) for OFFICE OB Visit, NST, BPP in the office (Spanish).  No future appointments.  Verita Schneiders, MD [facility], [place]

## 2019-08-07 NOTE — Progress Notes (Signed)
I connected with  [name] on 08/07/19 at 10:35 AM EST by telephone with [facility] interpreter Angelica ID [id number] and verified that I am speaking with the correct person using two identifiers.   I discussed the limitations, risks, security and privacy concerns of performing an evaluation and management service by telephone and the availability of in person appointments. I also discussed with the patient that there may be a patient responsible charge related to this service. The patient expressed understanding and agreed to proceed.  [name], RN 08/07/2019  10:39 AM

## 2019-08-26 ENCOUNTER — Encounter: Payer: Self-pay | Admitting: *Deleted

## 2019-08-26 ENCOUNTER — Other Ambulatory Visit: Payer: Self-pay

## 2019-08-26 ENCOUNTER — Ambulatory Visit: Payer: Self-pay

## 2019-08-26 ENCOUNTER — Ambulatory Visit (INDEPENDENT_AMBULATORY_CARE_PROVIDER_SITE_OTHER): Payer: Medicaid Other | Admitting: *Deleted

## 2019-08-26 ENCOUNTER — Ambulatory Visit (INDEPENDENT_AMBULATORY_CARE_PROVIDER_SITE_OTHER): Payer: Medicaid Other | Admitting: Obstetrics and Gynecology

## 2019-08-26 VITALS — BP 125/77 | HR 90 | Wt 182.9 lb

## 2019-08-26 DIAGNOSIS — O360130 Maternal care for anti-D [Rh] antibodies, third trimester, not applicable or unspecified: Secondary | ICD-10-CM

## 2019-08-26 DIAGNOSIS — Z23 Encounter for immunization: Secondary | ICD-10-CM

## 2019-08-26 DIAGNOSIS — O10919 Unspecified pre-existing hypertension complicating pregnancy, unspecified trimester: Secondary | ICD-10-CM

## 2019-08-26 DIAGNOSIS — Z603 Acculturation difficulty: Secondary | ICD-10-CM

## 2019-08-26 DIAGNOSIS — Z3A32 32 weeks gestation of pregnancy: Secondary | ICD-10-CM

## 2019-08-26 DIAGNOSIS — O0990 Supervision of high risk pregnancy, unspecified, unspecified trimester: Secondary | ICD-10-CM

## 2019-08-26 DIAGNOSIS — Z789 Other specified health status: Secondary | ICD-10-CM

## 2019-08-26 DIAGNOSIS — O10913 Unspecified pre-existing hypertension complicating pregnancy, third trimester: Secondary | ICD-10-CM

## 2019-08-26 DIAGNOSIS — O10013 Pre-existing essential hypertension complicating pregnancy, third trimester: Secondary | ICD-10-CM

## 2019-08-26 DIAGNOSIS — Z6791 Unspecified blood type, Rh negative: Secondary | ICD-10-CM

## 2019-08-26 DIAGNOSIS — O26893 Other specified pregnancy related conditions, third trimester: Secondary | ICD-10-CM

## 2019-08-26 LAB — FETAL NONSTRESS TEST

## 2019-08-26 NOTE — Progress Notes (Signed)
Interpreter raquel Leandro Reasoner present for encounter

## 2019-08-26 NOTE — Progress Notes (Signed)
Prenatal Visit Note Date: 08/26/2019 Clinic: [facility]  Subjective:  Jill Sexton is a 29 y.o. G3P1011 at [redacted]w[redacted]d being seen today for ongoing prenatal care.  She is currently monitored for the following issues for this high-risk pregnancy and has Supervision of high risk pregnancy, antepartum; Chronic hypertension in pregnancy; Language barrier; and Rh negative, antepartum on their problem list.  Patient reports no complaints.   Contractions: Not present. Vag. Bleeding: None.  Movement: Present. Denies leaking of fluid.   The following portions of the patient's history were reviewed and updated as appropriate: allergies, current medications, past family history, past medical history, past social history, past surgical history and problem list. Problem list updated.  Objective:   Vitals:   08/26/19 0834  BP: 125/77  Pulse: 90  Weight: 182 lb 14.4 oz (83 kg)    Fetal Status: Fetal Heart Rate (bpm): NST   Movement: Present     General:  Alert, oriented and cooperative. Patient is in no acute distress.  Skin: Skin is warm and dry. No rash noted.   Cardiovascular: Normal heart rate noted  Respiratory: Normal respiratory effort, no problems with respiration noted  Abdomen: Soft, gravid, appropriate for gestational age. Pain/Pressure: Present     Pelvic:  Cervical exam deferred        Extremities: Normal range of motion.  Edema: None  Mental Status: Normal mood and affect. Normal behavior. Normal judgment and thought content.   Urinalysis:      Assessment and Plan:  Pregnancy: G3P1011 at [redacted]w[redacted]d  1. Supervision of high risk pregnancy, antepartum Routine care. tdap today  2. Chronic hypertension in pregnancy Doing well on norvasc, low dose asa. bpp 8/10 (breathing) today. Needs growth u/s. FH normal  3. Language barrier Interpreter used  Preterm labor symptoms and general obstetric precautions including but not limited to vaginal bleeding, contractions,  leaking of fluid and fetal movement were reviewed in detail with the patient. Please refer to After Visit Summary for other counseling recommendations.  Return in about 1 week (around 09/02/2019) for nst/bpp with diane (weekly). 2wk hrob in person.   Aletha Halim, MD

## 2019-08-26 NOTE — Progress Notes (Signed)

## 2019-09-02 ENCOUNTER — Other Ambulatory Visit: Payer: Self-pay

## 2019-09-02 ENCOUNTER — Other Ambulatory Visit (HOSPITAL_COMMUNITY): Payer: Self-pay | Admitting: *Deleted

## 2019-09-02 ENCOUNTER — Ambulatory Visit (INDEPENDENT_AMBULATORY_CARE_PROVIDER_SITE_OTHER): Payer: Medicaid Other | Admitting: *Deleted

## 2019-09-02 ENCOUNTER — Ambulatory Visit (HOSPITAL_COMMUNITY)
Admission: RE | Admit: 2019-09-02 | Discharge: 2019-09-02 | Disposition: A | Discharge status: Home / Self Care | Payer: Medicaid Other | Source: Ambulatory Visit | Attending: Obstetrics & Gynecology | Admitting: Obstetrics & Gynecology

## 2019-09-02 VITALS — BP 128/81 | HR 87 | Wt 184.1 lb

## 2019-09-02 DIAGNOSIS — O10913 Unspecified pre-existing hypertension complicating pregnancy, third trimester: Secondary | ICD-10-CM | POA: Diagnosis not present

## 2019-09-02 DIAGNOSIS — Z3A33 33 weeks gestation of pregnancy: Secondary | ICD-10-CM | POA: Diagnosis not present

## 2019-09-02 DIAGNOSIS — Z362 Encounter for other antenatal screening follow-up: Secondary | ICD-10-CM

## 2019-09-02 DIAGNOSIS — O10013 Pre-existing essential hypertension complicating pregnancy, third trimester: Secondary | ICD-10-CM | POA: Diagnosis not present

## 2019-09-02 DIAGNOSIS — O10919 Unspecified pre-existing hypertension complicating pregnancy, unspecified trimester: Secondary | ICD-10-CM | POA: Insufficient documentation

## 2019-09-02 NOTE — Progress Notes (Signed)
[place] for growth & BPP today @ MFM

## 2019-09-09 ENCOUNTER — Ambulatory Visit (INDEPENDENT_AMBULATORY_CARE_PROVIDER_SITE_OTHER): Payer: Self-pay | Admitting: Obstetrics and Gynecology

## 2019-09-09 ENCOUNTER — Other Ambulatory Visit: Payer: Self-pay

## 2019-09-09 ENCOUNTER — Ambulatory Visit: Payer: Self-pay

## 2019-09-09 ENCOUNTER — Ambulatory Visit (INDEPENDENT_AMBULATORY_CARE_PROVIDER_SITE_OTHER): Payer: Medicaid Other | Admitting: *Deleted

## 2019-09-09 VITALS — BP 119/88 | HR 88 | Wt 185.7 lb

## 2019-09-09 DIAGNOSIS — O10919 Unspecified pre-existing hypertension complicating pregnancy, unspecified trimester: Secondary | ICD-10-CM

## 2019-09-09 DIAGNOSIS — O10013 Pre-existing essential hypertension complicating pregnancy, third trimester: Secondary | ICD-10-CM | POA: Diagnosis present

## 2019-09-09 DIAGNOSIS — O26899 Other specified pregnancy related conditions, unspecified trimester: Secondary | ICD-10-CM

## 2019-09-09 DIAGNOSIS — O0990 Supervision of high risk pregnancy, unspecified, unspecified trimester: Secondary | ICD-10-CM

## 2019-09-09 DIAGNOSIS — Z6791 Unspecified blood type, Rh negative: Secondary | ICD-10-CM

## 2019-09-09 DIAGNOSIS — Z3A34 34 weeks gestation of pregnancy: Secondary | ICD-10-CM

## 2019-09-09 DIAGNOSIS — O10913 Unspecified pre-existing hypertension complicating pregnancy, third trimester: Secondary | ICD-10-CM

## 2019-09-09 DIAGNOSIS — O360130 Maternal care for anti-D [Rh] antibodies, third trimester, not applicable or unspecified: Secondary | ICD-10-CM

## 2019-09-09 DIAGNOSIS — Z603 Acculturation difficulty: Secondary | ICD-10-CM

## 2019-09-09 DIAGNOSIS — Z789 Other specified health status: Secondary | ICD-10-CM

## 2019-09-09 NOTE — Progress Notes (Addendum)
Interpreter [employer] present for encounter.  [place] for growth/BPP done on 3/8.  Pt states she is checking BP once weekly. Her mother tells her the readings are normal. Pt was instructed to write down the results and to bring to each office visit. Pt was also provided with education regarding normal value for BP and what she should do if BP is > 140/90 or if she develops H/A or visual disturbances. ,

## 2019-09-09 NOTE — Progress Notes (Signed)
Subjective:  Jill Sexton is a 29 y.o. G3P1011 at [redacted]w[redacted]d being seen today for ongoing prenatal care.  She is currently monitored for the following issues for this high-risk pregnancy and has Supervision of high risk pregnancy, antepartum; Chronic hypertension in pregnancy; Language barrier; and Rh negative, antepartum on their problem list.  Patient reports no complaints.  Contractions: Not present. Vag. Bleeding: None.  Movement: Present. Denies leaking of fluid.   The following portions of the patient's history were reviewed and updated as appropriate: allergies, current medications, past family history, past medical history, past social history, past surgical history and problem list. Problem list updated.  Objective:   Vitals:   09/09/19 0835  BP: 119/88  Pulse: 88  Weight: 185 lb 11.2 oz (84.2 kg)    Fetal Status: Fetal Heart Rate (bpm): NST   Movement: Present     General:  Alert, oriented and cooperative. Patient is in no acute distress.  Skin: Skin is warm and dry. No rash noted.   Cardiovascular: Normal heart rate noted  Respiratory: Normal respiratory effort, no problems with respiration noted  Abdomen: Soft, gravid, appropriate for gestational age. Pain/Pressure: Present     Pelvic:  Cervical exam deferred        Extremities: Normal range of motion.  Edema: None  Mental Status: Normal mood and affect. Normal behavior. Normal judgment and thought content.   Urinalysis:      Assessment and Plan:  Pregnancy: G3P1011 at [redacted]w[redacted]d  1. Supervision of high risk pregnancy, antepartum Stable  2. Chronic hypertension in pregnancy BPP/NST today, continue weekly Growth 82 % last week BP stable on current treatment Pt instructed to record BP readings on paper and bring readings to all appts. BP goals reviewed  3. Rh negative, antepartum PP as indicated  4. Language barrier Live interrupter used during today's visit  Preterm labor symptoms and general obstetric precautions  including but not limited to vaginal bleeding, contractions, leaking of fluid and fetal movement were reviewed in detail with the patient. Please refer to After Visit Summary for other counseling recommendations.  Return in about 1 week (around 09/16/2019) for weekly as scheduled.   Chancy Milroy, MD

## 2019-09-09 NOTE — Patient Instructions (Signed)
Tercer trimestre de Media planner Third Trimester of Pregnancy  El tercer trimestre comprende desde la [employer] la [id number] (desde el mes7 hasta el mes9). En este trimestre, el beb en gestacin (feto) crece muy rpidamente. Hacia el final del noveno mes, el beb en gestacin mide alrededor de 20pulgadas (45cm) de largo. Pesa entre 6y 10libras [phone]). Siga estas indicaciones en su casa: Medicamentos  [employer] de venta libre y los recetados solamente como se lo haya indicado el mdico. Algunos medicamentos son seguros para tomar durante el Media planner y otros no lo son.  Tome vitaminas prenatales que contengan por lo menos [id number] (?g) de cido flico.  Si tiene dificultad para mover el intestino (estreimiento), tome un medicamento para ablandar las heces (laxante) si su mdico se lo autoriza. Comida y bebida   Ingiera alimentos saludables de [place] regular.  No coma carne cruda ni quesos sin cocinar.  Si obtiene poca cantidad de calcio de los alimentos que ingiere, consulte a su mdico sobre la posibilidad de tomar un suplemento diario de calcio.  La ingesta diaria de cuatro o cinco comidas pequeas en lugar de tres comidas abundantes.  Evite el consumo de alimentos ricos en grasas y azcares, como los alimentos fritos y los dulces.  Para evitar el estreimiento: ? Consuma alimentos ricos en fibra, como frutas y verduras frescas, cereales integrales y frijoles. ? Beba suficiente lquido para mantener el pis (orina) claro o de color amarillo plido. Actividad  Haga ejercicios solamente como se lo haya indicado el mdico. Interrumpa la actividad fsica si comienza a tener calambres.  No levante objetos pesados, use zapatos de tacones bajos y sintese derecha.  No haga ejercicio si hace demasiado calor, hay demasiada humedad o se encuentra en un lugar de mucha altura (altitud alta).  Puede continuar teniendo [employer], a menos que el  mdico le indique lo contrario. Alivio del dolor y del Tree surgeon  Use un sostn que le brinde buen soporte si sus mamas estn sensibles.  Haga pausas frecuentes y descanse con las piernas levantadas si tiene calambres en las piernas o dolor en la zona lumbar.  Dese baos de asiento con agua tibia para Best boy o las molestias causadas por las hemorroides. Use una crema para las hemorroides si el mdico la autoriza.  Si desarrolla venas hinchadas y abultadas (vrices) en las piernas: ? Use medias de compresin o medias de descanso como se lo haya indicado el mdico. ? Levante (eleve) los pies durante 103mnutos, 3 o 4veces por dTraining and development officer ? Limite el consumo de sal en sus alimentos. Seguridad  CMetLifecinturn de seguridad cuando conduzca.  Haga una lista de los nmeros de telfono de eFreight forwarder que iBJ'snmeros de telfono de familiares, amigos, eSt. Georgehospital, as como los departamentos de polica y bomberos. Preparacin para la llegada del beb Para prepararse para la llegada de su beb:  Tome clases prenatales.  Practique ir mGuardian Life Insuranceal hospital.  VPalo Verde Hospitaly recorra el rea de maternidad.  Hable en su trabajo acerca de tomar licencia cuando llegue el beb.  Prepare el bolso que llevar al hospital.  Prepare la habitacin del beb.  Concurra a los controles mdicos.  Compre un asiento de seguridad oAutoNationatrs para llevar al beb en el automvil. Aprenda cmo instalarlo en el auto. Instrucciones generales  No se d baos de inmersin en agua caliente, baos turcos ni saunas.  No consuma ningn producto que contenga nicotina o tabaco, como cigarrillos y cigarrillos  electrnicos. Si necesita ayuda para dejar de fumar, consulte al mdico.  No beba alcohol.  No se haga duchas vaginales ni use tampones o toallas higinicas perfumadas.  No mantenga las piernas cruzadas durante mucho tiempo.  No haga viajes de larga distancia, excepto si es  obligatorio. Hgalos solamente si su mdico la autoriza.  Visite a su dentista si no lo ha hecho durante el embarazo. Use un cepillo de cerdas suaves para cepillarse los dientes. Psese el hilo dental con suavidad.  Evite el contacto con las bandejas sanitarias de los gatos y la tierra que estos animales usan. Estos elementos contienen bacterias que pueden causar defectos congnitos al beb y la posible prdida del beb (aborto espontneo) o la muerte fetal.  Concurra a todas las visitas prenatales como se lo haya indicado el mdico. Esto es importante. Comunquese con un mdico si:  No est segura de si est en trabajo de parto o si ha roto la bolsa de las aguas.  Tiene mareos.  Tiene clicos leves o siente presin en la parte baja del vientre.  Sufre un dolor persistente en el abdomen.  Sigue teniendo malestar estomacal, vomita o tiene heces lquidas.  Advierte un lquido con olor ftido que proviene de la vagina.  Siente dolor al orinar. Solicite ayuda de inmediato si:  Tiene fiebre.  Tiene una prdida de lquido por la vagina.  Tiene sangrado o pequeas prdidas vaginales.  Siente dolor intenso o clicos en el abdomen.  Aumenta o baja de peso rpidamente.  Tiene dificultades para recuperar el aliento y siente dolor en el pecho.  Sbitamente se le hinchan mucho el rostro, las manos, los tobillos, los pies o las piernas.  No ha sentido los movimientos del beb durante una hora.  Siente un dolor de cabeza intenso que no se alivia con medicamentos.  Tiene dificultad para ver.  Tiene prdida de lquido o le sale un chorro de lquido de la vagina antes de estar en la semana 37.  Tiene espasmos abdominales (contracciones) regulares antes de estar en la semana 37. Resumen  El tercer trimestre comprende desde la semana28 hasta la semana40 (desde el mes7 hasta el mes9). Esta es la poca en que el beb en gestacin crece muy rpidamente.  Siga los consejos del mdico  con respecto a los medicamentos, la alimentacin y la actividad.  Preprese para la llegada del beb tomando las clases prenatales, preparando todo lo que necesitar el beb, arreglando la habitacin del beb y concurriendo a los controles mdicos.  Solicite ayuda de inmediato si tiene sangrado por la vagina, siente dolor en el pecho o tiene dificultad para respirar, o si no ha sentido que su beb se mueve en el transcurso de ms de una hora. Esta informacin no tiene como fin reemplazar el consejo del mdico. Asegrese de hacerle al mdico cualquier pregunta que tenga. Document Revised: 01/16/2017 Document Reviewed: 01/16/2017 Elsevier Patient Education  2020 Elsevier Inc.  

## 2019-09-16 ENCOUNTER — Other Ambulatory Visit: Payer: Self-pay

## 2019-09-16 ENCOUNTER — Ambulatory Visit (INDEPENDENT_AMBULATORY_CARE_PROVIDER_SITE_OTHER): Payer: Medicaid Other | Admitting: *Deleted

## 2019-09-16 ENCOUNTER — Ambulatory Visit: Payer: Self-pay

## 2019-09-16 ENCOUNTER — Encounter: Payer: Self-pay | Admitting: Family Medicine

## 2019-09-16 VITALS — BP 124/79 | HR 76 | Wt 185.7 lb

## 2019-09-16 DIAGNOSIS — O10919 Unspecified pre-existing hypertension complicating pregnancy, unspecified trimester: Secondary | ICD-10-CM

## 2019-09-16 DIAGNOSIS — O10913 Unspecified pre-existing hypertension complicating pregnancy, third trimester: Secondary | ICD-10-CM

## 2019-09-16 DIAGNOSIS — Z3A35 35 weeks gestation of pregnancy: Secondary | ICD-10-CM

## 2019-09-16 NOTE — Progress Notes (Signed)
Interpreter [employer] present for encounter. Pt reports having clear fluid leaking x2 days as well as large amount of white/clear fluid which is different than usual  Pt informed that the ultrasound is considered a limited OB ultrasound and is not intended to be a complete ultrasound exam.  Patient also informed that the ultrasound is not being completed with the intent of assessing for fetal or placental anomalies or any pelvic abnormalities.  Explained that the purpose of today's ultrasound is to assess for presentation, BPP and amniotic fluid volume.  Patient acknowledges the purpose of the exam and the limitations of the study.

## 2019-09-23 ENCOUNTER — Other Ambulatory Visit: Payer: Self-pay

## 2019-09-23 ENCOUNTER — Ambulatory Visit (INDEPENDENT_AMBULATORY_CARE_PROVIDER_SITE_OTHER): Payer: Medicaid Other | Admitting: *Deleted

## 2019-09-23 ENCOUNTER — Ambulatory Visit (INDEPENDENT_AMBULATORY_CARE_PROVIDER_SITE_OTHER): Payer: Medicaid Other | Admitting: Family Medicine

## 2019-09-23 ENCOUNTER — Ambulatory Visit: Payer: Self-pay

## 2019-09-23 VITALS — BP 126/79 | HR 85 | Wt 186.4 lb

## 2019-09-23 DIAGNOSIS — O10919 Unspecified pre-existing hypertension complicating pregnancy, unspecified trimester: Secondary | ICD-10-CM

## 2019-09-23 DIAGNOSIS — Z3A36 36 weeks gestation of pregnancy: Secondary | ICD-10-CM

## 2019-09-23 DIAGNOSIS — Z113 Encounter for screening for infections with a predominantly sexual mode of transmission: Secondary | ICD-10-CM

## 2019-09-23 DIAGNOSIS — Z789 Other specified health status: Secondary | ICD-10-CM

## 2019-09-23 DIAGNOSIS — O10913 Unspecified pre-existing hypertension complicating pregnancy, third trimester: Secondary | ICD-10-CM

## 2019-09-23 DIAGNOSIS — O0993 Supervision of high risk pregnancy, unspecified, third trimester: Secondary | ICD-10-CM

## 2019-09-23 DIAGNOSIS — O0990 Supervision of high risk pregnancy, unspecified, unspecified trimester: Secondary | ICD-10-CM

## 2019-09-23 LAB — POCT URINALYSIS DIP (DEVICE)
Bilirubin Urine: NEGATIVE
Glucose, UA: NEGATIVE mg/dL
Hgb urine dipstick: NEGATIVE
Ketones, ur: NEGATIVE mg/dL
Leukocytes,Ua: NEGATIVE
Nitrite: NEGATIVE
Protein, ur: NEGATIVE mg/dL
Specific Gravity, Urine: >=1.03 (ref 1.005–1.030)
Urobilinogen, UA: 0.2 mg/dL (ref 0.0–1.0)
pH: 5.5 (ref 5.0–8.0)

## 2019-09-23 NOTE — Patient Instructions (Signed)

## 2019-09-23 NOTE — Progress Notes (Signed)
   PRENATAL VISIT NOTE  Subjective:  Jill Sexton is a 29 y.o. G3P1011 at [redacted]w[redacted]d being seen today for ongoing prenatal care.  She is currently monitored for the following issues for this high-risk pregnancy and has Supervision of high risk pregnancy, antepartum; Chronic hypertension in pregnancy; Language barrier; and Rh negative, antepartum on their problem list.  Patient reports no complaints.  Contractions: Irregular. Vag. Bleeding: None.  Movement: Present. Denies leaking of fluid.   The following portions of the patient's history were reviewed and updated as appropriate: allergies, current medications, past family history, past medical history, past social history, past surgical history and problem list.   Objective:   Vitals:   09/23/19 0818  BP: 126/79  Pulse: 85  Weight: 186 lb 6.4 oz (84.6 kg)    Fetal Status: Fetal Heart Rate (bpm): NST Fundal Height: 37 cm Movement: Present  Presentation: Vertex  General:  Alert, oriented and cooperative. Patient is in no acute distress.  Skin: Skin is warm and dry. No rash noted.   Cardiovascular: Normal heart rate noted  Respiratory: Normal respiratory effort, no problems with respiration noted  Abdomen: Soft, gravid, appropriate for gestational age.  Pain/Pressure: Present     Pelvic: Cervical exam performed in the presence of a chaperone Dilation: 1.5 Effacement (%): 30 Station: -2  Extremities: Normal range of motion.  Edema: None  Mental Status: Normal mood and affect. Normal behavior. Normal judgment and thought content.  NST:  Baseline: 135 bpm, Variability: Good {> 6 bpm), Accelerations: Reactive and Decelerations: Absent   Assessment and Plan:  Pregnancy: G3P1011 at [redacted]w[redacted]d 1. Supervision of high risk pregnancy, antepartum BPP 8/10 - [place] MFM FETAL BPP WO NON STRESS; Future - GC/Chlamydia probe amp ([place])not at [facility] - Strep Gp B NAA  2. Chronic hypertension in pregnancy On Norvasc and ASA BP is well controlled. IOL  scheduled, orders placed - [place] MFM FETAL BPP WO NON STRESS; Future U/S for growth with MFM and BPP next week IOL at 39 wks arranged  3. Language barrier Live Spanish interpreter: Earnest Bailey used   Preterm labor symptoms and general obstetric precautions including but not limited to vaginal bleeding, contractions, leaking of fluid and fetal movement were reviewed in detail with the patient. Please refer to After Visit Summary for other counseling recommendations.   Return in 2 weeks (on 10/07/2019) for [facility], in person, OB visit and BPP.  Future Appointments  Date Time Provider [place]  09/30/2019  1:45 PM [place] MFC-US  09/30/2019  1:45 PM [place] 2 WH-MFCUS MFC-US  10/07/2019  9:15 AM Aletha Halim, MD [facility]    Donnamae Jude, MD

## 2019-09-23 NOTE — Progress Notes (Signed)
Interpreter Jill Sexton present for encounter.  Pt reports having an area of skin discoloration (darkening)  on her Lt forearm. Pt has [place] for growth on 4/5, BPP to be added.

## 2019-09-24 LAB — GC/CHLAMYDIA PROBE AMP (~~LOC~~) NOT AT ARMC
Chlamydia: NEGATIVE
Comment: NEGATIVE
Comment: NORMAL
Neisseria Gonorrhea: NEGATIVE

## 2019-09-25 LAB — STREP GP B NAA: Strep Gp B NAA: NEGATIVE

## 2019-09-30 ENCOUNTER — Inpatient Hospital Stay (HOSPITAL_COMMUNITY): Admission: RE | Admit: 2019-09-30 | Payer: Self-pay | Source: Ambulatory Visit

## 2019-09-30 ENCOUNTER — Encounter (HOSPITAL_COMMUNITY): Payer: Self-pay | Admitting: *Deleted

## 2019-09-30 ENCOUNTER — Ambulatory Visit (HOSPITAL_COMMUNITY): Payer: Self-pay | Admitting: *Deleted

## 2019-09-30 ENCOUNTER — Other Ambulatory Visit: Payer: Self-pay

## 2019-09-30 ENCOUNTER — Ambulatory Visit (HOSPITAL_COMMUNITY)
Admission: RE | Admit: 2019-09-30 | Discharge: 2019-09-30 | Disposition: A | Discharge status: Home / Self Care | Payer: Medicaid Other | Source: Ambulatory Visit | Attending: Obstetrics and Gynecology | Admitting: Obstetrics and Gynecology

## 2019-09-30 ENCOUNTER — Ambulatory Visit (HOSPITAL_COMMUNITY): Payer: Self-pay

## 2019-09-30 DIAGNOSIS — Z362 Encounter for other antenatal screening follow-up: Secondary | ICD-10-CM | POA: Diagnosis not present

## 2019-09-30 DIAGNOSIS — O0990 Supervision of high risk pregnancy, unspecified, unspecified trimester: Secondary | ICD-10-CM | POA: Insufficient documentation

## 2019-09-30 DIAGNOSIS — O360130 Maternal care for anti-D [Rh] antibodies, third trimester, not applicable or unspecified: Secondary | ICD-10-CM | POA: Diagnosis not present

## 2019-09-30 DIAGNOSIS — Z789 Other specified health status: Secondary | ICD-10-CM | POA: Insufficient documentation

## 2019-09-30 DIAGNOSIS — O26899 Other specified pregnancy related conditions, unspecified trimester: Secondary | ICD-10-CM | POA: Insufficient documentation

## 2019-09-30 DIAGNOSIS — Z6791 Unspecified blood type, Rh negative: Secondary | ICD-10-CM

## 2019-09-30 DIAGNOSIS — O10919 Unspecified pre-existing hypertension complicating pregnancy, unspecified trimester: Secondary | ICD-10-CM | POA: Insufficient documentation

## 2019-09-30 DIAGNOSIS — O10013 Pre-existing essential hypertension complicating pregnancy, third trimester: Secondary | ICD-10-CM | POA: Diagnosis not present

## 2019-09-30 DIAGNOSIS — Z3A37 37 weeks gestation of pregnancy: Secondary | ICD-10-CM

## 2019-09-30 DIAGNOSIS — O99213 Obesity complicating pregnancy, third trimester: Secondary | ICD-10-CM

## 2019-10-02 ENCOUNTER — Telehealth (HOSPITAL_COMMUNITY): Payer: Self-pay | Admitting: *Deleted

## 2019-10-02 ENCOUNTER — Encounter (HOSPITAL_COMMUNITY): Payer: Self-pay | Admitting: *Deleted

## 2019-10-02 NOTE — Telephone Encounter (Signed)
[id number] interpreter number

## 2019-10-03 ENCOUNTER — Other Ambulatory Visit (HOSPITAL_COMMUNITY): Payer: Self-pay | Admitting: Advanced Practice Midwife

## 2019-10-04 ENCOUNTER — Encounter (HOSPITAL_COMMUNITY): Payer: Self-pay | Admitting: Obstetrics & Gynecology

## 2019-10-04 ENCOUNTER — Inpatient Hospital Stay (HOSPITAL_COMMUNITY)
Admission: AD | Admit: 2019-10-04 | Discharge: 2019-10-06 | DRG: 807 | Disposition: A | Discharge status: Home / Self Care | Payer: Medicaid Other | Attending: Obstetrics & Gynecology | Admitting: Obstetrics & Gynecology

## 2019-10-04 DIAGNOSIS — O26893 Other specified pregnancy related conditions, third trimester: Secondary | ICD-10-CM | POA: Diagnosis present

## 2019-10-04 DIAGNOSIS — O0990 Supervision of high risk pregnancy, unspecified, unspecified trimester: Secondary | ICD-10-CM

## 2019-10-04 DIAGNOSIS — I10 Essential (primary) hypertension: Secondary | ICD-10-CM | POA: Diagnosis present

## 2019-10-04 DIAGNOSIS — O1002 Pre-existing essential hypertension complicating childbirth: Principal | ICD-10-CM | POA: Diagnosis present

## 2019-10-04 DIAGNOSIS — O10919 Unspecified pre-existing hypertension complicating pregnancy, unspecified trimester: Secondary | ICD-10-CM

## 2019-10-04 DIAGNOSIS — Z6791 Unspecified blood type, Rh negative: Secondary | ICD-10-CM

## 2019-10-04 DIAGNOSIS — Z3A38 38 weeks gestation of pregnancy: Secondary | ICD-10-CM

## 2019-10-04 DIAGNOSIS — Z20822 Contact with and (suspected) exposure to covid-19: Secondary | ICD-10-CM | POA: Diagnosis present

## 2019-10-04 DIAGNOSIS — Z789 Other specified health status: Secondary | ICD-10-CM | POA: Diagnosis present

## 2019-10-04 DIAGNOSIS — Z8759 Personal history of other complications of pregnancy, childbirth and the puerperium: Secondary | ICD-10-CM

## 2019-10-04 LAB — URINALYSIS, ROUTINE W REFLEX MICROSCOPIC
Bilirubin Urine: NEGATIVE
Glucose, UA: NEGATIVE mg/dL
Hgb urine dipstick: NEGATIVE
Ketones, ur: NEGATIVE mg/dL
Leukocytes,Ua: NEGATIVE
Nitrite: NEGATIVE
Protein, ur: NEGATIVE mg/dL
Specific Gravity, Urine: 1.013 (ref 1.005–1.030)
pH: 6 (ref 5.0–8.0)

## 2019-10-04 NOTE — MAU Note (Signed)
Pt reports to MAU c/o ctx since 0400. Pt reports they have been irregular but she started bleeding. Pt reports the bleeding started around 1200 and it is only when she uses the bathroom. +FM. No LOF. Pt reports this afternoon the ctx are stronger.

## 2019-10-05 ENCOUNTER — Other Ambulatory Visit: Payer: Self-pay

## 2019-10-05 ENCOUNTER — Encounter (HOSPITAL_COMMUNITY): Payer: Self-pay | Admitting: Family Medicine

## 2019-10-05 DIAGNOSIS — Z603 Acculturation difficulty: Secondary | ICD-10-CM | POA: Diagnosis not present

## 2019-10-05 DIAGNOSIS — O1002 Pre-existing essential hypertension complicating childbirth: Secondary | ICD-10-CM | POA: Diagnosis present

## 2019-10-05 DIAGNOSIS — Z8759 Personal history of other complications of pregnancy, childbirth and the puerperium: Secondary | ICD-10-CM

## 2019-10-05 DIAGNOSIS — Z6791 Unspecified blood type, Rh negative: Secondary | ICD-10-CM | POA: Diagnosis not present

## 2019-10-05 DIAGNOSIS — Z20822 Contact with and (suspected) exposure to covid-19: Secondary | ICD-10-CM | POA: Diagnosis present

## 2019-10-05 DIAGNOSIS — Z3A38 38 weeks gestation of pregnancy: Secondary | ICD-10-CM | POA: Diagnosis not present

## 2019-10-05 DIAGNOSIS — O26893 Other specified pregnancy related conditions, third trimester: Secondary | ICD-10-CM | POA: Diagnosis present

## 2019-10-05 LAB — PROTEIN / CREATININE RATIO, URINE
Creatinine, Urine: 47.78 mg/dL
Protein Creatinine Ratio: 0.13 mg/mg{Cre} (ref 0.00–0.15)
Total Protein, Urine: 6 mg/dL

## 2019-10-05 LAB — COMPREHENSIVE METABOLIC PANEL
ALT: 14 U/L (ref 0–44)
AST: 16 U/L (ref 15–41)
Albumin: 3.1 g/dL — ABNORMAL LOW (ref 3.5–5.0)
Alkaline Phosphatase: 230 U/L — ABNORMAL HIGH (ref 38–126)
Anion gap: 12 (ref 5–15)
BUN: 9 mg/dL (ref 6–20)
CO2: 20 mmol/L — ABNORMAL LOW (ref 22–32)
Calcium: 9 mg/dL (ref 8.9–10.3)
Chloride: 105 mmol/L (ref 98–111)
Creatinine, Ser: 0.5 mg/dL (ref 0.44–1.00)
GFR calc Af Amer: >60 mL/min (ref >60)
GFR calc non Af Amer: >60 mL/min (ref >60)
Glucose, Bld: 89 mg/dL (ref 70–99)
Potassium: 3.8 mmol/L (ref 3.5–5.1)
Sodium: 137 mmol/L (ref 135–145)
Total Bilirubin: 0.5 mg/dL (ref 0.3–1.2)
Total Protein: 7 g/dL (ref 6.5–8.1)

## 2019-10-05 LAB — RESPIRATORY PANEL BY RT PCR (FLU A&B, COVID)
Influenza A by PCR: NEGATIVE
Influenza B by PCR: NEGATIVE
SARS Coronavirus 2 by RT PCR: NEGATIVE

## 2019-10-05 LAB — RPR: RPR Ser Ql: NONREACTIVE

## 2019-10-05 LAB — CBC
HCT: 36.1 % (ref 36.0–46.0)
Hemoglobin: 12 g/dL (ref 12.0–15.0)
MCH: 28.6 pg (ref 26.0–34.0)
MCHC: 33.2 g/dL (ref 30.0–36.0)
MCV: 86.2 fL (ref 80.0–100.0)
Platelets: 194 10*3/uL (ref 150–400)
RBC: 4.19 MIL/uL (ref 3.87–5.11)
RDW: 14.1 % (ref 11.5–15.5)
WBC: 10.4 10*3/uL (ref 4.0–10.5)
nRBC: 0 % (ref 0.0–0.2)

## 2019-10-05 LAB — TYPE AND SCREEN
ABO/RH(D): O NEG
Antibody Screen: NEGATIVE

## 2019-10-05 LAB — POCT FERN TEST: POCT Fern Test: POSITIVE

## 2019-10-05 MED ORDER — FENTANYL CITRATE (PF) 100 MCG/2ML IJ SOLN: 100.0000 ug | INTRAMUSCULAR | Status: DC | PRN

## 2019-10-05 MED ORDER — MISOPROSTOL 25 MCG QUARTER TABLET
25.0000 ug | ORAL_TABLET | ORAL | Status: DC | PRN
  Filled 2019-10-05: qty 1

## 2019-10-05 MED ORDER — ONDANSETRON HCL 4 MG PO TABS: 4.0000 mg | ORAL_TABLET | ORAL | Status: DC | PRN

## 2019-10-05 MED ORDER — ONDANSETRON HCL 4 MG/2ML IJ SOLN: 4.0000 mg | Freq: Four times a day (QID) | INTRAMUSCULAR | Status: DC | PRN

## 2019-10-05 MED ORDER — OXYTOCIN 40 UNITS IN NORMAL SALINE INFUSION - SIMPLE MED
2.5000 [IU]/h | INTRAVENOUS | Status: DC
  Administered 2019-10-05: 2.5 [IU]/h via INTRAVENOUS
  Filled 2019-10-05: qty 1000

## 2019-10-05 MED ORDER — ONDANSETRON HCL 4 MG/2ML IJ SOLN: 4.0000 mg | INTRAMUSCULAR | Status: DC | PRN

## 2019-10-05 MED ORDER — TETANUS-DIPHTH-ACELL PERTUSSIS 5-2.5-18.5 LF-MCG/0.5 IM SUSP: 0.5000 mL | Freq: Once | INTRAMUSCULAR | Status: DC

## 2019-10-05 MED ORDER — IBUPROFEN 600 MG PO TABS
600.0000 mg | ORAL_TABLET | Freq: Three times a day (TID) | ORAL | Status: DC | PRN
  Administered 2019-10-05 (×2): 600 mg via ORAL
  Filled 2019-10-05 (×2): qty 1

## 2019-10-05 MED ORDER — OXYTOCIN BOLUS FROM INFUSION
500.0000 mL | Freq: Once | INTRAVENOUS | Status: AC
  Administered 2019-10-05: 500 mL via INTRAVENOUS

## 2019-10-05 MED ORDER — AMLODIPINE BESYLATE 5 MG PO TABS
5.0000 mg | ORAL_TABLET | Freq: Every day | ORAL | Status: DC
  Administered 2019-10-05 – 2019-10-06 (×2): 5 mg via ORAL
  Filled 2019-10-05 (×2): qty 1

## 2019-10-05 MED ORDER — LACTATED RINGERS IV SOLN: INTRAVENOUS | Status: DC

## 2019-10-05 MED ORDER — BENZOCAINE-MENTHOL 20-0.5 % EX AERO
1.0000 "application " | INHALATION_SPRAY | CUTANEOUS | Status: DC | PRN
  Administered 2019-10-06: 1 via TOPICAL
  Filled 2019-10-05: qty 56

## 2019-10-05 MED ORDER — FLEET ENEMA 7-19 GM/118ML RE ENEM: 1.0000 | ENEMA | RECTAL | Status: DC | PRN

## 2019-10-05 MED ORDER — ACETAMINOPHEN 325 MG PO TABS: 650.0000 mg | ORAL_TABLET | ORAL | Status: DC | PRN

## 2019-10-05 MED ORDER — TERBUTALINE SULFATE 1 MG/ML IJ SOLN: 0.2500 mg | Freq: Once | INTRAMUSCULAR | Status: DC | PRN

## 2019-10-05 MED ORDER — ACETAMINOPHEN 325 MG PO TABS: 650.0000 mg | ORAL_TABLET | Freq: Four times a day (QID) | ORAL | Status: DC | PRN

## 2019-10-05 MED ORDER — MEASLES, MUMPS & RUBELLA VAC IJ SOLR: 0.5000 mL | Freq: Once | INTRAMUSCULAR | Status: DC

## 2019-10-05 MED ORDER — OXYCODONE-ACETAMINOPHEN 5-325 MG PO TABS: 2.0000 | ORAL_TABLET | ORAL | Status: DC | PRN

## 2019-10-05 MED ORDER — OXYCODONE-ACETAMINOPHEN 5-325 MG PO TABS: 1.0000 | ORAL_TABLET | ORAL | Status: DC | PRN

## 2019-10-05 MED ORDER — COCONUT OIL OIL: 1.0000 "application " | TOPICAL_OIL | Status: DC | PRN

## 2019-10-05 MED ORDER — LIDOCAINE HCL (PF) 1 % IJ SOLN
30.0000 mL | INTRAMUSCULAR | Status: DC | PRN
  Filled 2019-10-05: qty 30

## 2019-10-05 MED ORDER — DIPHENHYDRAMINE HCL 25 MG PO CAPS: 25.0000 mg | ORAL_CAPSULE | Freq: Four times a day (QID) | ORAL | Status: DC | PRN

## 2019-10-05 MED ORDER — SIMETHICONE 80 MG PO CHEW: 80.0000 mg | CHEWABLE_TABLET | ORAL | Status: DC | PRN

## 2019-10-05 MED ORDER — SOD CITRATE-CITRIC ACID 500-334 MG/5ML PO SOLN: 30.0000 mL | ORAL | Status: DC | PRN

## 2019-10-05 MED ORDER — WITCH HAZEL-GLYCERIN EX PADS: 1.0000 "application " | MEDICATED_PAD | CUTANEOUS | Status: DC | PRN

## 2019-10-05 MED ORDER — SENNOSIDES-DOCUSATE SODIUM 8.6-50 MG PO TABS
2.0000 | ORAL_TABLET | ORAL | Status: DC
  Administered 2019-10-05: 23:00:00 2 via ORAL
  Filled 2019-10-05: qty 2

## 2019-10-05 MED ORDER — RHO D IMMUNE GLOBULIN 1500 UNIT/2ML IJ SOSY
300.0000 ug | PREFILLED_SYRINGE | Freq: Once | INTRAMUSCULAR | Status: AC
  Administered 2019-10-05: 300 ug via INTRAVENOUS
  Filled 2019-10-05: qty 2

## 2019-10-05 MED ORDER — DIBUCAINE (PERIANAL) 1 % EX OINT: 1.0000 "application " | TOPICAL_OINTMENT | CUTANEOUS | Status: DC | PRN

## 2019-10-05 MED ORDER — LACTATED RINGERS IV SOLN: 500.0000 mL | INTRAVENOUS | Status: DC | PRN

## 2019-10-05 MED ORDER — PRENATAL MULTIVITAMIN CH
1.0000 | ORAL_TABLET | Freq: Every day | ORAL | Status: DC
  Administered 2019-10-05 – 2019-10-06 (×2): 1 via ORAL
  Filled 2019-10-05: qty 1

## 2019-10-05 NOTE — Lactation Note (Signed)
This note was copied from a baby's chart. Lactation Consultation Note  Patient Name: Jill Sexton [id number] Date: 10/05/2019 Reason for consult: Initial assessment;Early term 37-38.6wks;Other (Comment)(Spanish interpreter - [place])  Baby is 102 hours old / mom and experienced Breast feeder for 1 month and  Pumped for 3 months. Milk came in well and breast changes.  LC offered to show mom to hand express and mom was able to return demo.  2 ml of EBM obtained. LC assessed breast tissue with hand expressing and noted the breast to be wide spaced.  LC showed mom, dad and grandmother how to spoon feed.  Baby awake and rooting, LC assisted with latch on the left  breast / football /  And baby opened wide and latched easily with depth and was still feeding at 8 mins  With swallows / increased with compressions and moist heat compress.  Per mom comfortable.  Per mom will need a hand pump prior to discharge.  Per mom is not active with WIC.  LC provided the [facility] pamphlet with phone numbers.   Maternal Data Has patient been taught Hand Expression?: Yes Does the patient have breastfeeding experience prior to this delivery?: Yes  Feeding Feeding Type: Breast Fed  LATCH Score Latch: Grasps breast easily, tongue down, lips flanged, rhythmical sucking.  Audible Swallowing: Spontaneous and intermittent  Type of Nipple: Everted at rest and after stimulation  Comfort (Breast/Nipple): Soft / non-tender  Hold (Positioning): Assistance needed to correctly position infant at breast and maintain latch.  LATCH Score: 9  Interventions Interventions: Breast feeding basics reviewed;Assisted with latch;Breast massage;Breast compression;Adjust position;Support pillows;Position options  Lactation Tools Discussed/Used WIC Program: No   Consult Status Consult Status: Follow-up Date: 10/06/19 Follow-up type: In-patient    [place] 10/05/2019, 2:43 PM

## 2019-10-05 NOTE — Discharge Summary (Signed)
Postpartum Discharge Summary     Patient Name: Jill Sexton DOB: 12/01/1990 MRN: [id number]  Date of admission: 10/04/2019 Delivering Provider: Chauncey Mann   Date of discharge: 10/06/2019  Admitting diagnosis: Chronic hypertension in pregnancy [O10.919] Intrauterine pregnancy: [redacted]w[redacted]d    Secondary diagnosis:  Active Problems:   Chronic hypertension in pregnancy   Language barrier   Rh negative, antepartum   Hx of preeclampsia, prior pregnancy, currently pregnant  Additional problems: None     Discharge diagnosis: Term Pregnancy Delivered and CHTN                                                                                                Post partum procedures:None  Augmentation: None  Complications: None  Hospital course:  Onset of Labor With Vaginal Delivery     29y.o. yo G3P1011 at 350w1das admitted in Latent Labor on 10/04/2019. Patient had an uncomplicated labor course as follows: Initial SVE: 4/40/-2. SROM occurred in MAU and patient was admitted for expectant management. She then progressed to complete.  Membrane Rupture Time/Date: 12:28 AM ,10/05/2019   Intrapartum Procedures: Episiotomy: None [1]                                         Lacerations:  1st degree [2]  Patient had a delivery of a Viable infant. 10/05/2019  Information for the patient's newborn:  ChYurika, Pereda0[[id number]]Delivery Method: Vag-Spont     Pateint had an uncomplicated postpartum course. She was continued on Norvasc 5 mg and BP's monitored. Nexplanon to be placed outpatient at HD as patient does not have insurance. She is ambulating, tolerating a regular diet, passing flatus, and urinating well. Patient is discharged home in stable condition on 10/06/19.  Delivery time: 3:55 AM    Magnesium Sulfate received: No BMZ received: No Rhophylac:No MMR:No Transfusion:No  Physical exam  Vitals:   10/05/19 1836 10/05/19 2125 10/06/19 0415 10/06/19 1035  BP: (!) 136/91 112/65 115/71  115/67  Pulse: 78 88 83 85  Resp:   18   Temp: 97.9 F (36.6 C) 99.8 F (37.7 C) 98.2 F (36.8 C)   TempSrc: Oral Oral Oral   SpO2:  98% 99%   Weight:      Height:       General: alert, cooperative and no distress Lochia: appropriate Uterine Fundus: firm Incision: N/A DVT Evaluation: No evidence of DVT seen on physical exam. Labs: Lab Results  Component Value Date   WBC 10.4 10/05/2019   HGB 12.0 10/05/2019   HCT 36.1 10/05/2019   MCV 86.2 10/05/2019   PLT 194 10/05/2019   CMP Latest Ref Rng & Units 10/05/2019  Glucose 70 - 99 mg/dL 89  BUN 6 - 20 mg/dL 9  Creatinine 0.44 - 1.00 mg/dL 0.50  Sodium 135 - 145 mmol/L 137  Potassium 3.5 - 5.1 mmol/L 3.8  Chloride 98 - 111 mmol/L 105  CO2 22 - 32 mmol/L 20(L)  Calcium 8.9 - 10.3  mg/dL 9.0  Total Protein 6.5 - 8.1 g/dL 7.0  Total Bilirubin 0.3 - 1.2 mg/dL 0.5  Alkaline Phos 38 - 126 U/L 230(H)  AST 15 - 41 U/L 16  ALT 0 - 44 U/L 14   Edinburgh Score: No flowsheet data found.  Discharge instruction: per After Visit Summary and "Baby and Me Booklet".  After visit meds:  Allergies as of 10/06/2019   No Known Allergies     Medication List    STOP taking these medications   aspirin EC 81 MG tablet     TAKE these medications   amLODipine 5 MG tablet Commonly known as: NORVASC Take 1 tablet (5 mg total) by mouth daily.   ibuprofen 600 MG tablet Commonly known as: ADVIL Take 1 tablet (600 mg total) by mouth every 8 (eight) hours as needed for mild pain.   PRENATAL VITAMINS PO Take 1 tablet by mouth daily.   Vitafol Ultra 29-0.6-0.4-200 MG Caps Take 1 tablet by mouth daily.       Diet: routine diet  Activity: Advance as tolerated. Pelvic rest for 6 weeks.   Outpatient follow up:4 weeks Follow up Appt: Future Appointments  Date Time Provider [place]  10/07/2019  8:15 AM WOC-WOCA NST [place] [facility]  10/07/2019  9:15 AM Aletha Halim, MD WOC-WOCA WOC  10/09/2019  9:15 AM MC-SCREENING MC-SDSC  None   Follow up Visit: [place] [facility] Follow up.   Specialty: Obstetrics and Gynecology Why: For a blood pressure check in 1 week Contact information: [address], [place] [id number] [place] [phone]            Please schedule this patient for Postpartum visit in: 4 weeks with the following provider: Any provider Virtual For C/S patients schedule nurse incision check in weeks 2 weeks: no Low risk pregnancy complicated by: HTN Delivery mode:  SVD Anticipated Birth Control:  Nexplanon at HD PP Procedures needed: BP check  Schedule Integrated [place] visit: no     Newborn Data: Live born female  Birth Weight: 3225g  APGAR: 59, 8  Newborn Delivery   Birth date/time: 10/05/2019 03:55:00 Delivery type: Vaginal, Spontaneous      Baby Feeding: Breast Disposition:home with mother   10/06/2019 Wende Mott, CNM

## 2019-10-05 NOTE — Plan of Care (Signed)
  Problem: Education: Goal: Knowledge of Childbirth will improve 10/05/2019 0519 by Glenice Bow, RN Outcome: Progressing 10/05/2019 0131 by Glenice Bow, RN Outcome: Progressing Goal: Ability to make informed decisions regarding treatment and plan of care will improve 10/05/2019 0519 by Glenice Bow, RN Outcome: Progressing 10/05/2019 0131 by Glenice Bow, RN Outcome: Progressing Goal: Ability to state and carry out methods to decrease the pain will improve 10/05/2019 0519 by Glenice Bow, RN Outcome: Progressing 10/05/2019 0131 by Glenice Bow, RN Outcome: Progressing Goal: Individualized Educational Video(s) Outcome: Progressing

## 2019-10-05 NOTE — H&P (Addendum)
OBSTETRIC ADMISSION HISTORY AND PHYSICAL  Jill Sexton is a 29 y.o. female G3P1011 with IUP at [redacted]w[redacted]d by LMP presenting for ctx since 0400 yesterday with minimal vaginal bleeding. SROM occurred in MAU. She reports +FMs, no blurry vision, headaches or peripheral edema, and RUQ pain.  She plans on breast feeding. She requests Nexplanon for birth control. She received her prenatal care at [facility]   Dating: By LMP --->  Estimated Date of Delivery: 10/18/19  Sono:  4/5  @[redacted]w[redacted]d , CWD, normal anatomy, cephalic presentation, posterior fundal lie, 3232g, 61% EFW  Prenatal History/Complications: cHTN on Norvasc Language barrier Rh negative Hx D&E Hx Pre-E  Past Medical History: Past Medical History:  Diagnosis Date  . Chronic hypertension    on amlodipine  . Missed abortion     Past Surgical History: Past Surgical History:  Procedure Laterality Date  . DILATION AND EVACUATION N/A 10/30/2018   Procedure: DILATATION AND EVACUATION;  Surgeon: Woodroe Mode, MD;  Location: [facility];  Service: Gynecology;  Laterality: N/A;  . NO PAST SURGERIES      Obstetrical History: OB History    Gravida  3   Para  1   Term  1   Preterm  0   AB  1   Living  1     SAB  1   TAB  0   Ectopic  0   Multiple  0   Live Births  1           Social History: Social History   Socioeconomic History  . Marital status: Married    Spouse name: Not on file  . Number of children: Not on file  . Years of education: Not on file  . Highest education level: Not on file  Occupational History  . Not on file  Tobacco Use  . Smoking status: Never Smoker  . Smokeless tobacco: Never Used  Substance and Sexual Activity  . Alcohol use: No  . Drug use: No  . Sexual activity: Yes    Birth control/protection: Condom  Other Topics Concern  . Not on file  Social History Narrative  . Not on file   Social Determinants of Health   Financial Resource Strain:   . Difficulty of Paying  Living Expenses:   Food Insecurity: No Food Insecurity  . Worried About Charity fundraiser in the Last Year: Never true  . Ran Out of Food in the Last Year: Never true  Transportation Needs: No Transportation Needs  . Lack of Transportation (Medical): No  . Lack of Transportation (Non-Medical): No  Physical Activity:   . Days of Exercise per Week:   . Minutes of Exercise per Session:   Stress:   . Feeling of Stress :   Social Connections:   . Frequency of Communication with Friends and Family:   . Frequency of Social Gatherings with Friends and Family:   . Attends Religious Services:   . Active Member of Clubs or Organizations:   . Attends Archivist Meetings:   Marland Kitchen Marital Status:     Family History: Family History  Problem Relation Age of Onset  . Hypertension Mother     Allergies: No Known Allergies  Medications Prior to Admission  Medication Sig Dispense Refill Last Dose  . amLODipine (NORVASC) 5 MG tablet Take 1 tablet (5 mg total) by mouth daily. 30 tablet 6 10/03/2019 at Unknown time  . aspirin EC 81 MG tablet Take 1 tablet (81 mg  total) by mouth daily. Take after 12 weeks for prevention of preeclampsia later in pregnancy 300 tablet 2 10/03/2019 at Unknown time  . Prenatal Vit-Fe Fumarate-FA (PRENATAL VITAMINS PO) Take 1 tablet by mouth daily.   10/03/2019 at Unknown time  . Prenat-Fe Poly-Methfol-FA-DHA (VITAFOL ULTRA) 29-0.6-0.4-200 MG CAPS Take 1 tablet by mouth daily. (Patient not taking: Reported on 08/07/2019) 30 capsule 12      Review of Systems   All systems reviewed and negative except as stated in HPI  Blood pressure (!) 142/82, pulse 91, temperature 98.5 F (36.9 C), temperature source Oral, resp. rate 18, height 5' (1.524 m), weight 84.4 kg, last menstrual period 01/11/2019, unknown if currently breastfeeding. General appearance: alert, cooperative and appears stated age Lungs: normal effort Heart: regular rate  Abdomen: soft, non-tender; bowel  sounds normal Pelvic: gravid uterus Extremities: Homans sign is negative, no sign of DVT Presentation: cephalic Fetal monitoringBaseline: 140 bpm, Variability: Good {> 6 bpm), Accelerations: Reactive and Decelerations: Absent Uterine activity: Frequency: Every 2-3 minutes Dilation: 7 Effacement (%): 90 Station: 0 Exam by:: Dr. Marice Potter   Prenatal labs: ABO, Rh: --/--/O NEG (04/10 0051) Antibody: NEG (04/10 0051) Rubella: 13.60 (09/29 1244) RPR: Non Reactive (02/04 0849)  HBsAg: Negative (09/29 1244)  HIV: Non Reactive (02/04 0849)  GBS: Negative/-- (03/29 0948)  2 hr Glucola WNL Genetic screening  Low risk Anatomy US WNL  Prenatal Transfer Tool  Maternal Diabetes: No Genetic Screening: Normal Maternal Ultrasounds/Referrals: Normal Fetal Ultrasounds or other Referrals:  None Maternal Substance Abuse:  No Significant Maternal Medications:  None Significant Maternal Lab Results: Group B Strep negative and Rh negative  Results for orders placed or performed during the hospital encounter of 10/04/19 (from the past 24 hour(s))  Urinalysis, Routine w reflex microscopic   Collection Time: 10/04/19 10:09 PM  Result Value Ref Range   Color, Urine STRAW (A) YELLOW   APPearance CLEAR CLEAR   Specific Gravity, Urine 1.013 1.005 - 1.030   pH 6.0 5.0 - 8.0   Glucose, UA NEGATIVE NEGATIVE mg/dL   Hgb urine dipstick NEGATIVE NEGATIVE   Bilirubin Urine NEGATIVE NEGATIVE   Ketones, ur NEGATIVE NEGATIVE mg/dL   Protein, ur NEGATIVE NEGATIVE mg/dL   Nitrite NEGATIVE NEGATIVE   Leukocytes,Ua NEGATIVE NEGATIVE  Fern Test   Collection Time: 10/05/19 12:32 AM  Result Value Ref Range   POCT Fern Test Positive = ruptured amniotic membanes   Respiratory Panel by RT PCR (Flu A&B, Covid) - Nasopharyngeal Swab   Collection Time: 10/05/19 12:36 AM   Specimen: Nasopharyngeal Swab  Result Value Ref Range   SARS Coronavirus 2 by RT PCR NEGATIVE NEGATIVE   Influenza A by PCR NEGATIVE NEGATIVE    Influenza B by PCR NEGATIVE NEGATIVE  CBC   Collection Time: 10/05/19 12:51 AM  Result Value Ref Range   WBC 10.4 4.0 - 10.5 K/uL   RBC 4.19 3.87 - 5.11 MIL/uL   Hemoglobin 12.0 12.0 - 15.0 g/dL   HCT 36.1 36.0 - 46.0 %   MCV 86.2 80.0 - 100.0 fL   MCH 28.6 26.0 - 34.0 pg   MCHC 33.2 30.0 - 36.0 g/dL   RDW 14.1 11.5 - 15.5 %   Platelets 194 150 - 400 K/uL   nRBC 0.0 0.0 - 0.2 %  Comprehensive metabolic panel   Collection Time: 10/05/19 12:51 AM  Result Value Ref Range   Sodium 137 135 - 145 mmol/L   Potassium 3.8 3.5 - 5.1 mmol/L   Chloride 105  98 - 111 mmol/L   CO2 20 (L) 22 - 32 mmol/L   Glucose, Bld 89 70 - 99 mg/dL   BUN 9 6 - 20 mg/dL   Creatinine, Ser 0.50 0.44 - 1.00 mg/dL   Calcium 9.0 8.9 - 10.3 mg/dL   Total Protein 7.0 6.5 - 8.1 g/dL   Albumin 3.1 (L) 3.5 - 5.0 g/dL   AST 16 15 - 41 U/L   ALT 14 0 - 44 U/L   Alkaline Phosphatase 230 (H) 38 - 126 U/L   Total Bilirubin 0.5 0.3 - 1.2 mg/dL   GFR calc non Af Amer >60 >60 mL/min   GFR calc Af Amer >60 >60 mL/min   Anion gap 12 5 - 15  Type and screen   Collection Time: 10/05/19 12:51 AM  Result Value Ref Range   ABO/RH(D) O NEG    Antibody Screen NEG    Sample Expiration      10/08/2019,2359 Performed at [facility] [address], [place], [place] [address]     Patient Active Problem List   Diagnosis Date Noted  . Rh negative, antepartum 03/26/2019  . Supervision of high risk pregnancy, antepartum 03/19/2019  . Chronic hypertension in pregnancy 03/19/2019  . Language barrier 03/19/2019    Assessment/Plan:  Jill Sexton is a 29 y.o. G3P1011 at [redacted]w[redacted]d here for SOL/SROM.  #Labor: Vertex by RN exam. Expectant management. Pit PRN. Anticipate SVD. #Pain: Per patient request #FWB: Cat I; EFW: 3400g #ID:  GBS neg #MOF: Breast #MOC: Nexplanon outpatient as she does not have insurance #Circ:  Declines #cHTN: cont PTA Norvasc, CMP and Pr/Cr ordered. Hx of Pre-E. Asymptomatic currently.   Barrington Ellison, MD [facility] Family Medicine Fellow, [facility] for [facility], [facility] 10/05/2019, 1:53 AM

## 2019-10-05 NOTE — Plan of Care (Signed)
  Problem: Education: Goal: Knowledge of Childbirth will improve Outcome: Progressing Goal: Ability to make informed decisions regarding treatment and plan of care will improve Outcome: Progressing Goal: Ability to state and carry out methods to decrease the pain will improve Outcome: Progressing   

## 2019-10-06 LAB — RH IG WORKUP (INCLUDES ABO/RH)
ABO/RH(D): O NEG
Fetal Screen: NEGATIVE
Gestational Age(Wks): 38.1
Unit division: 0

## 2019-10-06 LAB — BIRTH TISSUE RECOVERY COLLECTION (PLACENTA DONATION)

## 2019-10-06 MED ORDER — IBUPROFEN 600 MG PO TABS: 600.0000 mg | ORAL_TABLET | Freq: Three times a day (TID) | ORAL | 0 refills | Status: DC | PRN

## 2019-10-06 NOTE — Discharge Instructions (Signed)
Cuidados ps-parto aps parto normal Postpartum Care After Vaginal Delivery Este folheto fornece informaes sobre como cuidar de si, desde o momento do parto at as 6-12 [employer] (perodo ps-parto). Seu mdico tambm poder fornecer instrues mais especficas. Caso tenha problemas ou perguntas, entre em contato com o seu mdico. Siga essas instrues em casa: Sangramento vaginal   normal ter sangramento vaginal (lquios) aps o parto. Use um absorvente para sangramento e secreo vaginal. ? Durante a primeira semana aps o parto, a quantidade e a aparncia dos lquios costumam ser semelhantes a um perodo menstrual. ? Nas semanas seguintes, ele diminuir gradualmente para uma secreo amarelada e seca. ? Para a [employer], os lquios cessam completamente at 4-6 semanas aps o parto. O sangramento vaginal pode variar de mulher para mulher.  Troque seus absorventes com frequncia. Fique atento a qualquer alterao no seu fluxo, como: ? Um aumento repentino no volume. ? Mudana na cor. ? Grandes cogulos sanguneos.  Se voc expelir um cogulo sanguneo pela vagina, guarde-o e ligue para seu mdico. No elimine os cogulos no vaso sanitrio sem falar com seu mdico antes.  No use absorventes internos nem faa duchas vaginais at o seu mdico dizer que  seguro faz-lo.  Caso no esteja amamentando, sua menstruao dever retornar 6-8 semanas aps o parto. Caso esteja alimentando a criana apenas no peito (aleitamento materno exclusivo), seu ciclo menstrual pode no retornar at [employer]. Cuidados com a rea perineal  Mantenha a rea entre a vagina e o nus (perneo) limpa e seca conforme orientado pelo seu mdico. Use absorventes com medicamentos e sprays e cremes para aliviar a dor conforme orientado.  Se um corte no perneo (episiotomia) ou um rasgo na vagina tiver sido feito em voc, verifique se h sinais de infeco na rea at [employer] Verifique a ocorrncia de: ? [place], do Psychologist, forensic. ? Lquido ou sangue saindo do corte ou rasgo. ? Calor. ? Pus ou mau cheiro.  Voc pode receber uma garrafa de esguicho para usar em vez de passar papel higinico para limpar a rea do perneo depois de ir ao banheiro. Conforme comear a Archivist, voc pode usar o esguicho antes de se limpar. Lembre-se de limpar suavemente.  Para aliviar a dor causada por uma episiotomia, um corte na vagina ou veias inchadas no nus (hemorroidas), tente tomar um banho de assento morno 2-3 vezes ao dia. Um banho de assento  um banho morno que voc toma enquanto est sentada. A gua deve chegar somente at o seu quadril e cobrir suas ndegas. Cuidados com os seios  Nos primeiros dias aps o parto, suas mamas podem ficar pesadas, cheias e desconfortveis (ingurgitao mamria). Tambm pode vazar leite de suas mamas. Seu mdico pode sugerir formas de Public house manager o desconforto. A ingurgitao mamria deve passar em poucos dias.  Caso esteja amamentando: ? Use um suti que oferece apoio aos seus seios e seja do tamanho adequado. ? Mantenha os mamilos limpos e secos. Aplique cremes e pomadas conforme as orientado pelo seu mdico. ? Voc pode precisar usar absorventes de mama para reter o leite que vazar de seus seios. ? Voc pode ter contraes uterinas toda vez que amamentar durante vrias semanas aps o parto. As contraes uterinas ajudam seu tero a retornar ao tamanho normal. ? Se voc tiver algum problema com a amamentao, converse com seu mdico ou consultor de lactao.  Caso no esteja amamentando: ? Evite tocar muito nas mamas. Isso  pode fazer com que seus seios produzam mais leite. ? Use um suti bem ajustado e compressas frias para ajudar no inchao. ? No extraia leite (bombear). Isso faz voc produzir [facility]. Intimidade e sexualidade  Pergunte ao seu mdico sobre quando voc poder ter relaes sexuais. Isso  poder depender de: ? Seu risco de infeces. ? Se voc est cicatrizando rpido. ? Seu conforto e desejo de Optometrist atividade sexual.  Voc pode engravidar aps o parto mesmo se voc no tiver [employer]. Se desejar, converse com seu mdico sobre mtodos de controle de natalidade (contracepo). [place] medicamentos vendidos com ou sem receita mdica somente de acordo com as indicaes do seu mdico.  Caso tenha recebido uma prescrio de antibitico, tome-o somente como determinado pelo seu mdico. No pare de tomar o antibitico mesmo se comear a se Warden/ranger. Atividades  Retorne gradualmente s suas atividades normais de acordo com as orientaes do seu mdico. Pergunte ao seu mdico quais atividades so seguras para voc.  Repouse o mximo possvel. Tente descansar ou tirar uma soneca quando seu beb estiver dormindo. Alimentos e bebidas   Beba lquidos em quantidade suficiente para manter a urina na cor amarelo-claro.  Consuma alimentos com elevado teor de fibras todos os dias. Eles podero ajudar a prevenir ou aliviar a constipao. Alimentos ricos em fibras incluem: ? Cereais e pes integrais. ? Arroz integral. ? Feijes. ? Frutas e legumes frescos.  No tente perder peso rapidamente cortando calorias.  Tome suas vitaminas pr-natal at seu check-up ps-parto ou at [employer] mdico lhe dizer para parar. Estilo de vida  No use nenhum produto que contenha nicotina ou tabaco, como cigarros tradicionais e cigarros eletrnicos. Caso precise de ajuda para parar de fumar, fale com seu mdico.  No consuma lcool, especialmente se estiver amamentando. Instrues gerais  Comparea a todas as consultas de [employer] e do beb de acordo com as orientaes do seu mdico. A maioria das mulheres passa com seu mdico para um check-up ps-parto nas primeiras 3-6 semanas aps o parto. Entre em contato com um mdico se:  Sentir-se incapaz de lidar com as mudanas  que uma criana traz  sua vida, e esse sentimento no passar.  Voc se sentir incomumente triste ou preocupada.  Suas mamas ficarem vermelhas, doloridas ou duras.  Tiver febre.  Tiver dificuldade em segurar a Engineer, technical sales que a urina vaze.  Tiver pouco ou nenhum interesse nas atividades de que voc [place].  No tiver amamentado nenhuma vez e no menstruar por 12 semanas aps o parto.  Tiver parado de amamentar e no menstruar por 12 semanas aps parar de amamentar.  Tiver dvidas sobre como cuidar de voc mesma ou do beb.  Expelir um cogulo sanguneo pela vagina. [place] ajuda imediatamente se:  Sentir dor no peito.  Tiver dificuldade de Ambulance person.  Tiver dor repentina e intensa nas pernas.  Tiver dor intensa ou clicas na parte inferior do abdome.  Sangrar muito pela vagina a ponto de ter que usar mais de um absorvente por hora. O sangramento no pode ser mais intenso do que a menstruao mais intensa que voc costuma ter.  Sentir dor de cabea intensa.  Desmaiar.  Tiver viso embaada ou manchas na viso.  Tiver corrimento vaginal com cheiro ruim.  Tiver pensamentos de Retail banker a si mesma ou ao seu beb. Se sentir vontade de ferir a si mesmo ou a terceiros ou pensar em tirar a prpria vida, procure ajuda imediatamente. Voc pode ir para o pronto-socorro  mais prximo ou ligar para:  O nmero de Restaurant manager, fast food (911, nos EUA).  Um servio telefnico de preveno do suicdio, como o National Suicide Prevention Lifeline, no nmero [phone]. Ele funciona 24 horas por dia. Resumo  O perodo de tempo logo aps o nascimento do recm-nascido at 6-12 semanas aps o parto  chamado de perodo ps-parto.  Retorne gradualmente s suas atividades normais de acordo com as orientaes do seu mdico.  Comparea a todas as consultas de acompanhamento suas e do beb de acordo com as orientaes do seu mdico. Estas informaes no se destinam a substituir as  recomendaes de seu mdico. No deixe de discutir quaisquer dvidas com seu mdico. Document Revised: 09/23/2017 Elsevier Patient Education  [employer].

## 2019-10-06 NOTE — Lactation Note (Signed)
This note was copied from a baby's chart. Lactation Consultation Note  Patient Name: Jill Sexton [id number] Date: 10/06/2019 Reason for consult: Follow-up assessment;Early term 37-38.6wks;Infant weight loss  Visited with mom of a 64 hours old ETI female, she's a P2 and now BF and also supplementing with formula. Mom and baby are going home today, baby is at 3% weight loss. Reviewed discharge instructions, engorgement prevention/treatment, treatment/prevention for sore nipples and hand pump instructions, cleaning and storage; mom requested one to take home.  Explained to mom about supply and demand and breast stimulation to protect her supply, advised mom to pump whenever she's offering baby formula, parents aware that baby may be cluster feeding for another day or two. LC also educated parents on formula supplementation guidelines when supplementing along with BF. Parents reported all questions and concerns were answered, they're both aware of [facility] OP services and will call PRN.   Maternal Data    Feeding    LATCH Score                   Interventions Interventions: Breast feeding basics reviewed;Hand pump  Lactation Tools Discussed/Used Tools: Pump Breast pump type: Manual Pump Review: Setup, frequency, and cleaning Initiated by:: MPeck Date initiated:: 10/06/19   Consult Status Consult Status: Complete Date: 10/06/19 Follow-up type: Call as needed    [name] [name] 10/06/2019, 3:31 PM

## 2019-10-06 NOTE — Progress Notes (Signed)
Post Partum Day 1 Subjective: Patient reports feeling well. She is tolerating PO. Ambulating and urinating without difficulty. Lochia minimal.  Objective: Blood pressure 115/71, pulse 83, temperature 98.2 F (36.8 C), temperature source Oral, resp. rate 18, height 5' (1.524 m), weight 84.4 kg, last menstrual period 01/11/2019, SpO2 99 %, unknown if currently breastfeeding.  Physical Exam:  General: alert, cooperative and appears stated age Lochia: appropriate Uterine Fundus: firm Incision: NA DVT Evaluation: No evidence of DVT seen on physical exam.  Recent Labs    10/05/19 0051  HGB 12.0  HCT 36.1    Assessment/Plan: Plan for discharge tomorrow. Okay to discharge today if baby can; RN to page team for orders. Vitals stable CHTN: cont PTA Norvasc; good control Nexplanon at HD Breastfeeding   LOS: 1 day   Chauncey Mann 10/06/2019, 6:11 AM

## 2019-10-07 ENCOUNTER — Encounter: Payer: Self-pay | Admitting: Obstetrics and Gynecology

## 2019-10-07 ENCOUNTER — Other Ambulatory Visit: Payer: Self-pay

## 2019-10-09 ENCOUNTER — Other Ambulatory Visit (HOSPITAL_COMMUNITY): Payer: Self-pay

## 2019-10-11 ENCOUNTER — Inpatient Hospital Stay (HOSPITAL_COMMUNITY): Admission: AD | Admit: 2019-10-11 | Payer: Self-pay | Source: Home / Self Care | Admitting: Obstetrics and Gynecology

## 2019-10-11 ENCOUNTER — Inpatient Hospital Stay (HOSPITAL_COMMUNITY): Payer: Self-pay

## 2019-10-15 ENCOUNTER — Ambulatory Visit: Payer: Self-pay

## 2019-10-15 ENCOUNTER — Other Ambulatory Visit: Payer: Self-pay

## 2019-10-15 ENCOUNTER — Ambulatory Visit (INDEPENDENT_AMBULATORY_CARE_PROVIDER_SITE_OTHER): Payer: Self-pay | Admitting: *Deleted

## 2019-10-15 ENCOUNTER — Encounter: Payer: Self-pay | Admitting: *Deleted

## 2019-10-15 VITALS — BP 135/87 | HR 71 | Wt 170.1 lb

## 2019-10-15 DIAGNOSIS — Z0130 Encounter for examination of blood pressure without abnormal findings: Secondary | ICD-10-CM

## 2019-10-15 NOTE — Progress Notes (Signed)
Interpreter present for encounter. Pt reports that she has not checked her BP since delivery of the baby. She denies having any H/A or visual disturbances. She endorses taking Amlodipine 5 mg daily. Pt status reviewed with Marcille Buffy. Pt was instructed to continue taking Amlodipine as prescribed. She was also instructed to check her BP once per week. She should go to MAU if she develops severe H/A or visual disturbances which do not resolve with a brief rest period. Pt may contact us with questions or concerns. Post partum video appt is scheduled on 5/11 and pt is aware.

## 2019-10-15 NOTE — Progress Notes (Signed)
Patient seen and assessed by nursing staff during this encounter. I have reviewed the chart and agree with the documentation and plan. I have also made any necessary editorial changes.  Marcille Buffy DNP, CNM  10/15/19  5:00 PM

## 2019-11-05 ENCOUNTER — Telehealth (INDEPENDENT_AMBULATORY_CARE_PROVIDER_SITE_OTHER): Payer: Medicaid Other | Admitting: Nurse Practitioner

## 2019-11-05 ENCOUNTER — Encounter: Payer: Self-pay | Admitting: Nurse Practitioner

## 2019-11-05 DIAGNOSIS — Z789 Other specified health status: Secondary | ICD-10-CM

## 2019-11-05 DIAGNOSIS — Z603 Acculturation difficulty: Secondary | ICD-10-CM

## 2019-11-05 DIAGNOSIS — O10919 Unspecified pre-existing hypertension complicating pregnancy, unspecified trimester: Secondary | ICD-10-CM

## 2019-11-05 DIAGNOSIS — I10 Essential (primary) hypertension: Secondary | ICD-10-CM

## 2019-11-05 DIAGNOSIS — O1093 Unspecified pre-existing hypertension complicating the puerperium: Secondary | ICD-10-CM

## 2019-11-05 MED ORDER — AMLODIPINE BESYLATE 5 MG PO TABS: 5.0000 mg | ORAL_TABLET | Freq: Every day | ORAL | 2 refills | Status: DC

## 2019-11-05 NOTE — Progress Notes (Signed)
I connected with@ on 11/05/19 at 10:15 AM EDT by: Mychart and verified that I am speaking with the correct person using two identifiers.  Patient is located at home and provider is located at [facility].  In person interpreter is present with the provider for the entire visit.     The purpose of this virtual visit is to provide medical care while limiting exposure to the novel coronavirus. I discussed the limitations, risks, security and privacy concerns of performing an evaluation and management service by [facility] and the availability of in person appointments. I also discussed with the patient that there may be a patient responsible charge related to this service. By engaging in this virtual visit, you consent to the provision of healthcare.  Additionally, you authorize for your insurance to be billed for the services provided during this visit.  The patient expressed understanding and agreed to proceed.  The following staff members participated in the virtual visit:  Ubaldo Glassing, [place] and Earlie Server, NP  Post Partum Visit Note Subjective:   Jill Sexton is a 29 y.o. [phone] female being evaluated for postpartum followup.  She is 4 weeks postpartum following a normal spontaneous vaginal delivery at  38/1 gestational weeks.  I have fully reviewed the prenatal and intrapartum course; pregnancy complicated by chronic hypertension.  Postpartum course has been good. Baby is doing wel. Baby is feeding by both breast and bottle - [employer] Good Start. Bleeding no bleeding. Bowel function is normal. Bladder function is normal. Patient is not sexually active. Contraception method is none. Postpartum depression screening: negative.  The following portions of the patient's history were reviewed and updated as appropriate: allergies, current medications, past family history, past medical history, past social history, past surgical history and problem list.  Review of Systems Pertinent items noted in HPI and  remainder of comprehensive ROS otherwise negative.   Objective:  There were no vitals filed for this visit. Self-Obtained       Assessment:    Normal postpartum exam with chronic hypertension on medication for BP.    Plan:  Essential components of care per ACOG recommendations:  1.  Mood and well being: Patient with negative depression screening today. Reviewed local resources for support.  - Patient does not use tobacco.  - hx of drug use? No    2. Infant care and feeding:  -Patient currently breastmilk feeding? Yes  -Social determinants of health (SDOH) reviewed in EPIC. No concerns 3. Sexuality, contraception and birth spacing - Patient does not want a pregnancy in the next year.  Desired family size is unsure of the number of children.  - Reviewed forms of contraception in tiered fashion. Patient desired no method today.  Is planning to contact the health department and have Nexplanon inserted there. - Discussed birth spacing of 18 months  4. Sleep and fatigue -Encouraged family/partner/community support of 4 hrs of uninterrupted sleep to help with mood and fatigue  5. Physical Recovery  - Discussed patients delivery and complications - Patient had a 1st degree laceration, perineal healing reviewed. Patient expressed understanding - Patient has urinary incontinence? No  - Patient is safe to resume physical and sexual activity  6.  Health Maintenance - Last pap smear done 10-04-18 and was ASCUS  with negative HPV.  Advised to follow up at the Health Department for pap smear management.   7. Chronic Disease - hypertension - PCP follow up is planned by client.  Did refill for her medication and  now her PCP will be prescribing the refills.  10 minutes of non-face-to-face time spent with the patient    Bethanne Ginger, [place] for [employer], [place], RN, MSN, NP-BC Nurse Practitioner, [facility] for [employer], [place] [facility] 11/05/2019 2:07 PM

## 2020-03-15 ENCOUNTER — Other Ambulatory Visit: Payer: Self-pay | Admitting: Nurse Practitioner

## 2020-03-15 DIAGNOSIS — O10919 Unspecified pre-existing hypertension complicating pregnancy, unspecified trimester: Secondary | ICD-10-CM

## 2020-04-14 ENCOUNTER — Other Ambulatory Visit: Payer: Self-pay

## 2020-04-14 ENCOUNTER — Encounter (HOSPITAL_COMMUNITY): Payer: Self-pay | Admitting: *Deleted

## 2020-04-14 ENCOUNTER — Ambulatory Visit (HOSPITAL_COMMUNITY)
Admission: EM | Admit: 2020-04-14 | Discharge: 2020-04-14 | Disposition: A | Discharge status: Home / Self Care | Payer: Self-pay | Attending: Urgent Care | Admitting: Urgent Care

## 2020-04-14 DIAGNOSIS — I10 Essential (primary) hypertension: Secondary | ICD-10-CM

## 2020-04-14 MED ORDER — AMLODIPINE BESYLATE 5 MG PO TABS: 5.0000 mg | ORAL_TABLET | Freq: Every day | ORAL | 0 refills | Status: DC

## 2020-04-14 NOTE — ED Triage Notes (Signed)
Via video interpreter: Pt reports being on HTN med x 4 yrs.  States ran out of HTN med approx 3 months ago; requesting refill.  Denies any HA, blurred vision or any c/o's.

## 2020-04-14 NOTE — ED Provider Notes (Signed)
[facility]   MRN: [id number] DOB: 28-Aug-1990  Subjective:   Jill Sexton is a 29 y.o. female presenting for medication refill.  Patient has been taking amlodipine for her blood pressure. Denies dizziness, chronic headache, blurred vision, chest pain, shortness of breath, heart racing, palpitations, nausea, vomiting, abdominal pain, hematuria, lower leg swelling. Was getting refills at [facility] but has not been able to establish care with a PCP.   No current facility-administered medications for this encounter.  Current Outpatient Medications:  .  amLODipine (NORVASC) 5 MG tablet, Take 1 tablet (5 mg total) by mouth daily., Disp: 30 tablet, Rfl: 2 .  Ascorbic Acid (VITAMIN C) 100 MG tablet, Take 100 mg by mouth daily., Disp: , Rfl:  .  ibuprofen (ADVIL) 600 MG tablet, Take 1 tablet (600 mg total) by mouth every 8 (eight) hours as needed for mild pain. (Patient not taking: Reported on 10/15/2019), Disp: 30 tablet, Rfl: 0 .  Prenat-Fe Poly-Methfol-FA-DHA (VITAFOL ULTRA) 29-0.6-0.4-200 MG CAPS, Take 1 tablet by mouth daily. (Patient not taking: Reported on 08/07/2019), Disp: 30 capsule, Rfl: 12 .  Prenatal Vit-Fe Fumarate-FA (PRENATAL VITAMINS PO), Take 1 tablet by mouth daily., Disp: , Rfl:    No Known Allergies  Past Medical History:  Diagnosis Date  . Chronic hypertension    on amlodipine  . Missed abortion      Past Surgical History:  Procedure Laterality Date  . DILATION AND EVACUATION N/A 10/30/2018   Procedure: DILATATION AND EVACUATION;  Surgeon: Woodroe Mode, MD;  Location: [place];  Service: Gynecology;  Laterality: N/A;  . NO PAST SURGERIES      Family History  Problem Relation Age of Onset  . Hypertension Mother     Social History   Tobacco Use  . Smoking status: Never Smoker  . Smokeless tobacco: Never Used  Vaping Use  . Vaping Use: Never used  Substance Use Topics  . Alcohol use: No  . Drug use: No     ROS   Objective:   Vitals: BP 121/84   Pulse 65   Temp 98.1 F (36.7 C)   Resp 16   LMP 03/31/2020 (Approximate)   SpO2 96%   Breastfeeding Yes   BP Readings from Last 3 Encounters:  04/14/20 121/84  11/05/19 124/88  10/15/19 135/87   Physical Exam Constitutional:      General: She is not in acute distress.    Appearance: Normal appearance. She is well-developed. She is not ill-appearing, toxic-appearing or diaphoretic.  HENT:     Head: Normocephalic and atraumatic.     Nose: Nose normal.     Mouth/Throat:     Mouth: Mucous membranes are moist.     Pharynx: Oropharynx is clear.  Eyes:     General: No scleral icterus.       Right eye: No discharge.        Left eye: No discharge.     Extraocular Movements: Extraocular movements intact.     Conjunctiva/sclera: Conjunctivae normal.     Pupils: Pupils are equal, round, and reactive to light.  Cardiovascular:     Rate and Rhythm: Normal rate.  Pulmonary:     Effort: Pulmonary effort is normal.  Skin:    General: Skin is warm and dry.  Neurological:     General: No focal deficit present.     Mental Status: She is alert and oriented to person, place, and time.  Psychiatric:  Mood and Affect: Mood normal.        Behavior: Behavior normal.        Thought Content: Thought content normal.        Judgment: Judgment normal.     Assessment and Plan :   PDMP not reviewed this encounter.  1. Essential hypertension     Refilled amlodipine for 90 day supply. Counseled on general management of HTN. Recommended establishing care through [facility]. Counseled patient on potential for adverse effects with medications prescribed/recommended today, ER and return-to-clinic precautions discussed, patient verbalized understanding.    Jaynee Eagles, PA-C 04/14/20 1151

## 2020-06-13 IMAGING — US US MFM FETAL NUCHAL TRANSLUCENCY
1 series · 15 of 28 positions shown · non-contrast
Comparison: none

[Series 1: us mfm fetal nuchal translucency · 15 of 31 slices shown]
[im 1/31]
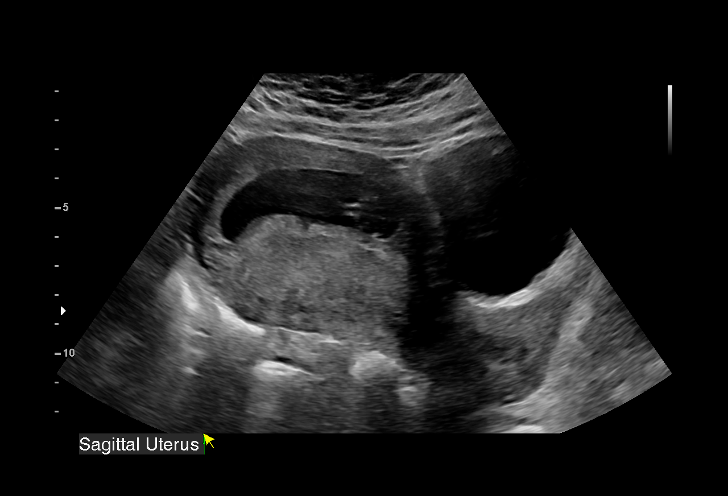
[im 3/31]
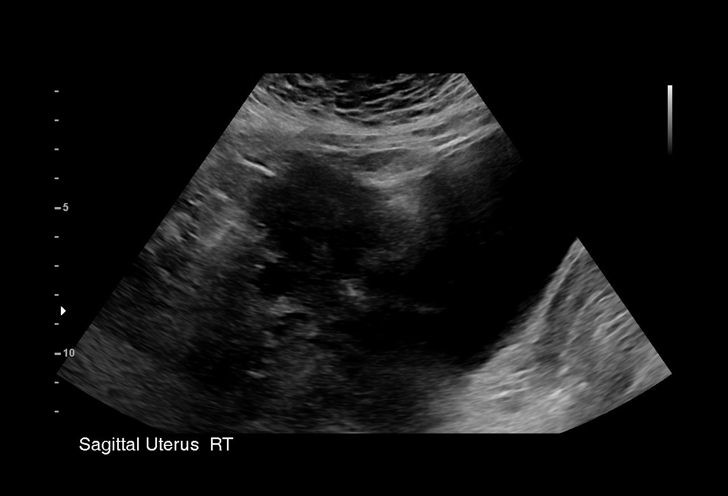
[im 5/31]
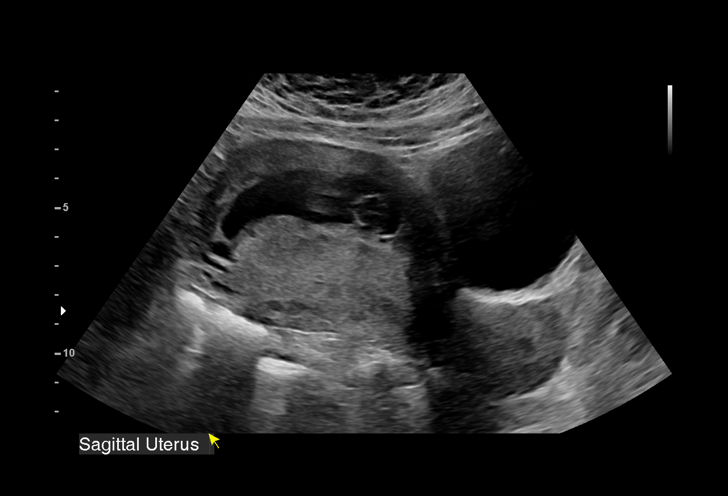
[im 7/31]
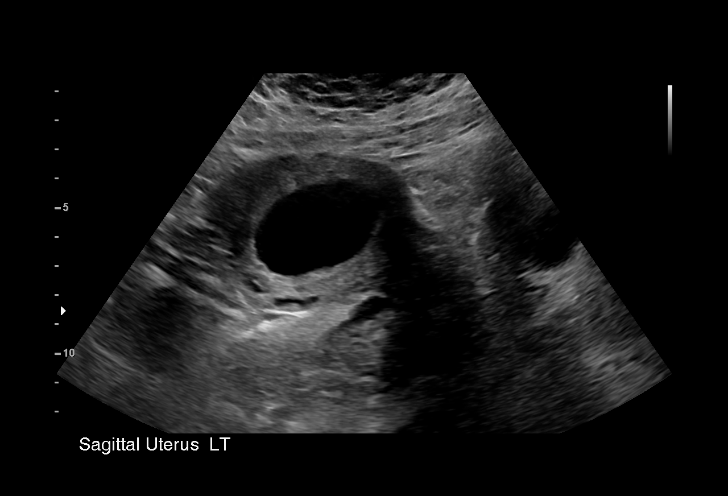
[im 9/31]
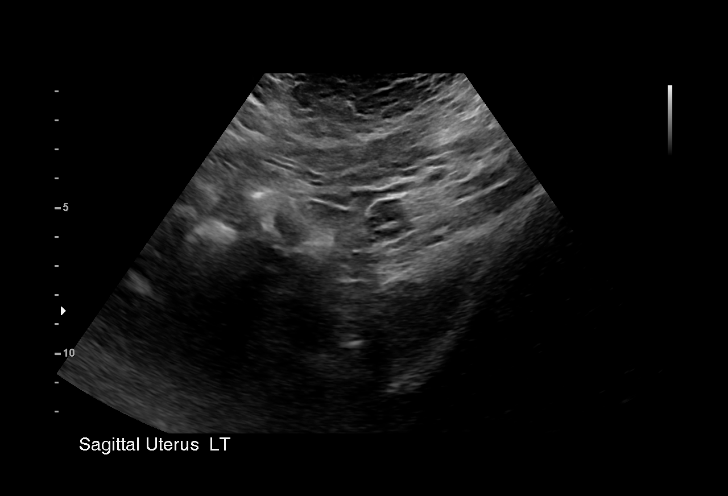
[im 12/31]
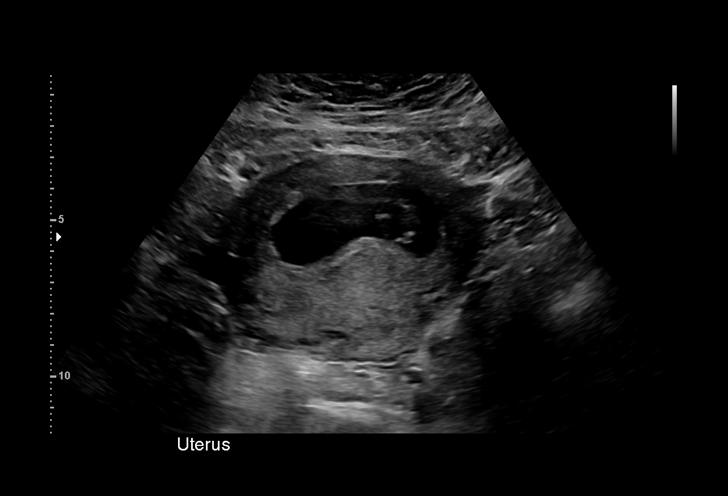
[im 14/31]
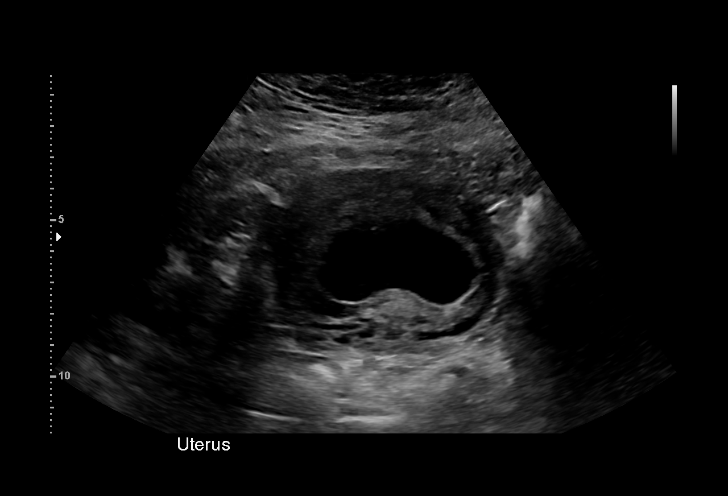
[im 16/31]
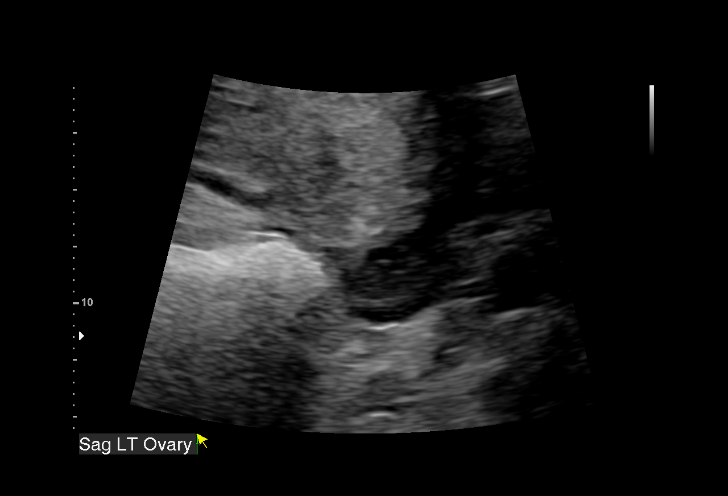
[im 17/31]
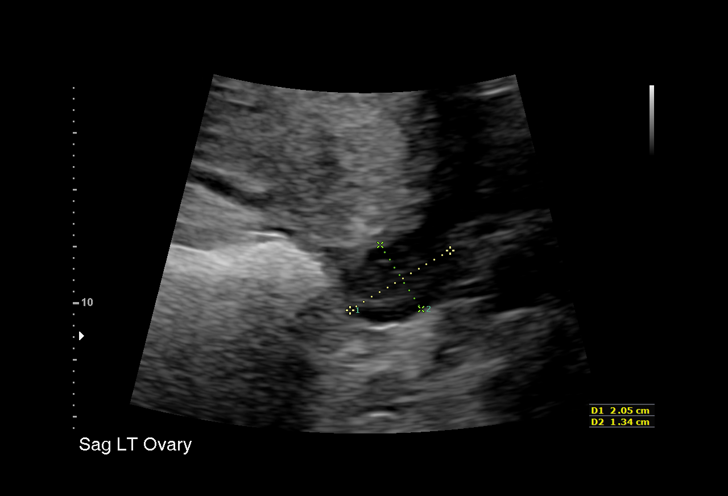
[im 19/31]
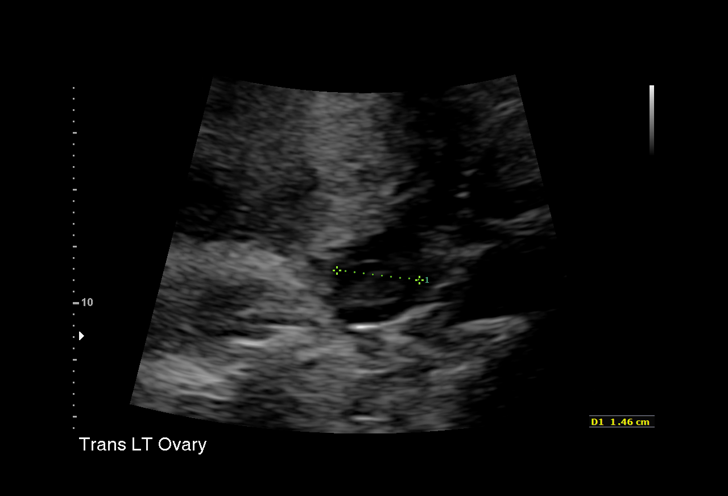
[im 22/31]
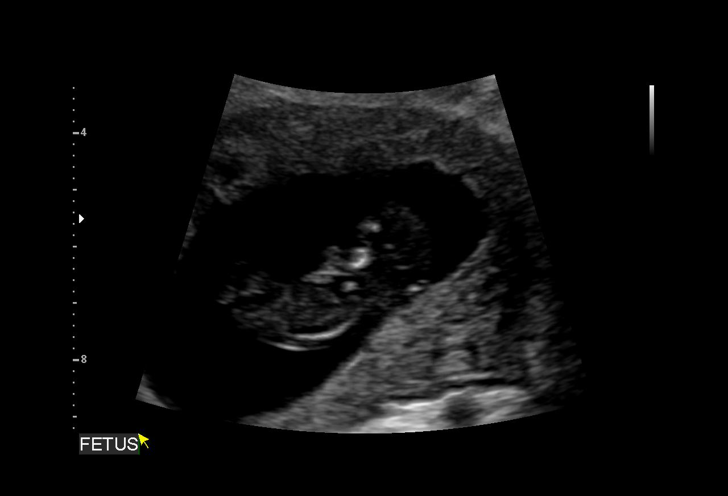
[im 24/31]
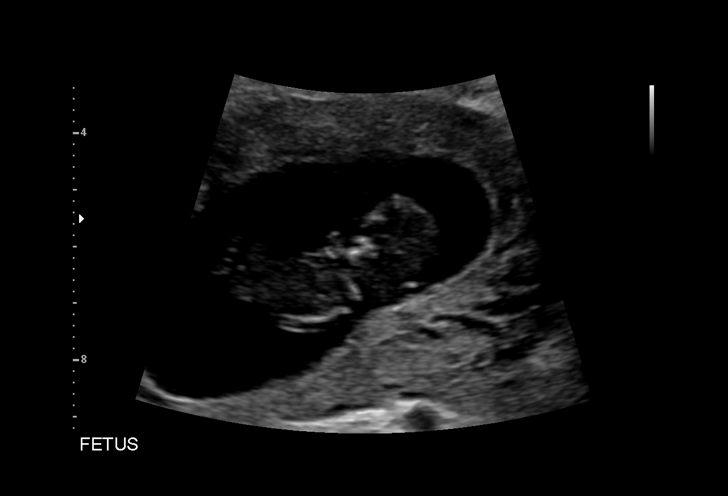
[im 26/31]
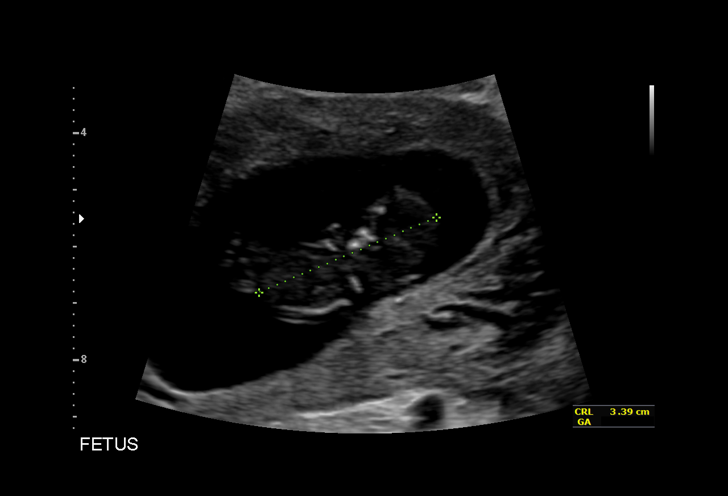
[im 28/31]
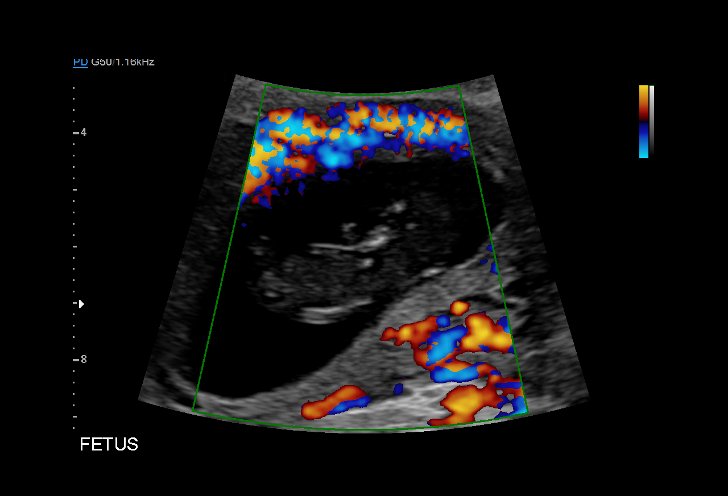
[im 31/31]
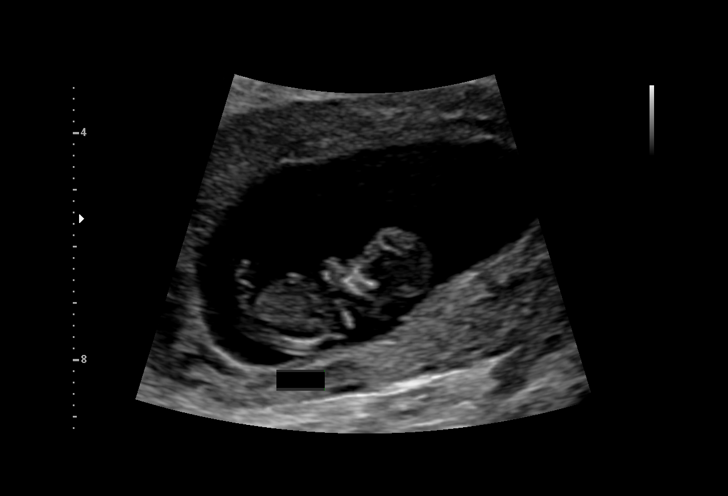

[15 of 28 positions shown; findings below may reference images not displayed]

[REDACTED]-
                                                            Faculty Physician
                   DARYEN PA                                 [HOSPITAL]
 Ref. Address:     [facility]

     TRANSLUCENCY
 ----------------------------------------------------------------------

 ----------------------------------------------------------------------
Indications

  12 weeks gestation of pregnancy
  Obesity complicating pregnancy, first
  trimester
  Encounter for nuchal translucency
 ----------------------------------------------------------------------
Fetal Evaluation

 Num Of Fetuses:         1
 Preg. Location:         Intrauterine
 Gest. Sac:              Intrauterine
 Yolk Sac:               Not visualized
 Fetal Pole:             Visualized
 Cardiac Activity:       Absent

 Amniotic Fluid
 AFI FV:      Within normal limits
Biometry

 CRL:      36.3  mm     G. Age:  10w 2d                  EDD:   05/18/19
OB History

 Gravidity:    2         Term:   1        Prem:   0        SAB:   0
 TOP:          0       Ectopic:  0        Living: 1
Gestational Age
 LMP:           12w 6d        Date:  07/24/18                 EDD:   04/30/19
 Best:          12w 6d     Det. By:  LMP  (07/24/18)          EDD:   04/30/19
Cervix Uterus Adnexa

 Cervix
 Normal appearance by transabdominal scan.

 Uterus
 No abnormality visualized.

 Left Ovary
 No adnexal mass visualized.

 Right Ovary
 No adnexal mass visualized.

 Cul De Sac
 No free fluid seen.

 Adnexa
 No abnormality visualized.
Impression

 Missed AB
 Discussed today's findings with Ms. Yova
Recommendations

 I conveyed today's results to Otsuki Singh Thakuri, CNM and Dr.
 Quach
 The faculty practice scheduled a virtual visit in 24-48 hours to
 perform further counseling
 Bleeding precautions were provided.

## 2020-07-14 ENCOUNTER — Other Ambulatory Visit: Payer: Self-pay

## 2020-07-14 ENCOUNTER — Ambulatory Visit: Payer: Self-pay | Attending: Family Medicine | Admitting: Family Medicine

## 2020-07-14 VITALS — BP 136/87 | HR 76 | Temp 98.8°F | Resp 18 | Ht 59.5 in | Wt 158.0 lb

## 2020-07-14 DIAGNOSIS — I10 Essential (primary) hypertension: Secondary | ICD-10-CM

## 2020-07-14 DIAGNOSIS — Z1159 Encounter for screening for other viral diseases: Secondary | ICD-10-CM

## 2020-07-14 MED ORDER — AMLODIPINE BESYLATE 5 MG PO TABS: 5.0000 mg | ORAL_TABLET | Freq: Every day | ORAL | 6 refills | Status: DC

## 2020-07-14 NOTE — Progress Notes (Signed)
Patient has eaten today. Patient has not taken medication today. Patient denies pain or any concerns upon leaving the hospital.

## 2020-07-14 NOTE — Patient Instructions (Signed)
https://www.nhlbi.nih.gov/files/docs/public/heart/dash_brief.pdf">  Plan de alimentacin DASH DASH Eating Plan DASH es la sigla en ingls de "Enfoques Alimentarios para Detener la Hipertensin". El plan de alimentacin DASH ha demostrado:  Bajar la presin arterial elevada (hipertensin).  Reducir el riesgo de diabetes tipo 2, enfermedad cardaca y accidente cerebrovascular.  Ayudar a perder peso. Consejos para seguir este plan Leer las etiquetas de los alimentos  Verifique la cantidad de sal (sodio) por porcin en las etiquetas de los alimentos. Elija alimentos con menos del 5 por ciento del valor diario de sodio. Generalmente, los alimentos con menos de 300 miligramos (mg) de sodio por porcin se encuadran dentro de este plan alimentario.  Para encontrar cereales integrales, busque la palabra "integral" como primera palabra en la lista de ingredientes. Al ir de compras  Compre productos en los que en su etiqueta diga: "bajo contenido de sodio" o "sin agregado de sal".  Compre alimentos frescos. Evite los alimentos enlatados y comidas precocidas o congeladas. Al cocinar  Evite agregar sal cuando cocine. Use hierbas o aderezos sin sal, en lugar de sal de mesa o sal marina. Consulte al mdico o farmacutico antes de usar sustitutos de la sal.  No fra los alimentos. A la hora de cocinar los alimentos opte por hornearlos, hervirlos, grillarlos, asarlos al horno y asarlos a la parrilla.  Cocine con aceites cardiosaludables, como oliva, canola, aguacate, soja o girasol. Planificacin de las comidas  Consuma una dieta equilibrada, que incluya lo siguiente: ? 4o ms porciones de frutas y 4 o ms porciones de verduras por da. Trate de que medio plato de cada comida sea de frutas y verduras. ? De 6 a 8porciones de cereales integrales todos los das. ? Menos de 6 onzas (170g) de carne, aves o pescado magros por da. Una porcin de 3 onzas (85g) de carne tiene casi el mismo tamao que un  mazo de cartas. Un huevo equivale a 1 onza (28g). ? De 2 a 3 porciones de productos lcteos descremados por da. Una porcin es 1taza (237ml). ? 1 porcin de frutos secos, semillas o frijoles 5 veces por semana. ? De 2 a 3 porciones de grasas cardiosaludables. Las grasas saludables llamadas cidos grasos omega-3 se encuentran en alimentos como las nueces, las semillas de lino, las leches fortificadas y los huevos. Estas grasas tambin se encuentran en los pescados de agua fra, como la sardina, el salmn y la caballa.  Limite la cantidad que consume de: ? Alimentos enlatados o envasados. ? Alimentos con alto contenido de grasa trans, como algunos alimentos fritos. ? Alimentos con alto contenido de grasa saturada, como carne con grasa. ? Postres y otros dulces, bebidas azucaradas y otros alimentos con azcar agregada. ? Productos lcteos enteros.  No le agregue sal a los alimentos antes de probarlos.  No coma ms de 4 yemas de huevo por semana.  Trate de comer al menos 2 comidas vegetarianas por semana.  Consuma ms comida casera y menos de restaurante, de bares y comida rpida.   Estilo de vida  Cuando coma en un restaurante, pida que preparen su comida con menos sal o, en lo posible, sin nada de sal.  Si bebe alcohol: ? Limite la cantidad que bebe:  De 0 a 1 medida por da para las mujeres que no estn embarazadas.  De 0 a 2 medidas por da para los hombres. ? Est atento a la cantidad de alcohol que hay en las bebidas que toma. En los [place], una medida equivale a una botella   de cerveza de 12oz (355ml), un vaso de vino de 5oz (148ml) o un vaso de una bebida alcohlica de alta graduacin de 1oz (44ml). Informacin general  Evite ingerir ms de 2300 mg de sal por da. Si tiene hipertensin, es posible que necesite reducir la ingesta de sodio a 1,500 mg por da.  Trabaje con su mdico para mantener un peso saludable o perder peso. Pregntele cul es el peso  recomendado para usted.  Realice al menos 30 minutos de ejercicio que haga que se acelere su corazn (ejercicio aerbico) la mayora de los das de la semana. Estas actividades pueden incluir caminar, nadar o andar en bicicleta.  Trabaje con su mdico o nutricionista para ajustar su plan alimentario a sus necesidades calricas personales. Qu alimentos debo comer? Frutas Todas las frutas frescas, congeladas o disecadas. Frutas enlatadas en jugo natural (sin agregado de azcar). Verduras Verduras frescas o congeladas (crudas, al vapor, asadas o grilladas). Jugos de tomate y verduras con bajo contenido de sodio o reducidos en sodio. Salsa y pasta de tomate con bajo contenido de sodio o reducidas en sodio. Verduras enlatadas con bajo contenido de sodio o reducidas en sodio. Granos Pan de salvado o integral. Pasta de salvado o integral. Arroz integral. Avena. Quinua. Trigo burgol. Cereales integrales y con bajo contenido de sodio. Pan pita. Galletitas de agua con bajo contenido de grasa y sodio. Tortillas de harina integral. Carnes y otras protenas Pollo o pavo sin piel. Carne de pollo o de pavo molida. Cerdo desgrasado. Pescado y mariscos. Claras de huevo. Porotos, guisantes o lentejas secos. Frutos secos, mantequilla de frutos secos y semillas sin sal. Frijoles enlatados sin sal. Cortes de carne vacuna magra, desgrasada. Carne precocida o curada magra y baja en sodio, como embutidos o panes de carne. Lcteos Leche descremada (1%) o descremada. Quesos reducidos en grasa, con bajo contenido de grasa o descremados. Queso blanco o ricota sin grasa, con bajo contenido de sodio. Yogur semidescremado o descremado. Queso con bajo contenido de grasa y sodio. Grasas y aceites Margarinas untables que no contengan grasas trans. Aceite vegetal. Mayonesa y aderezos para ensaladas livianos, reducidos en grasa o con bajo contenido de grasas (reducidos en sodio). Aceite de canola, crtamo, oliva, aguacate, soja y  girasol. Aguacate. Alios y condimentos Hierbas. Especias. Mezclas de condimentos sin sal. Otros alimentos Palomitas de maz y pretzels sin sal. Dulces con bajo contenido de grasas. Es posible que los productos que se enumeran ms arriba no constituyan una lista completa de los alimentos y las bebidas que puede tomar. Consulte a un nutricionista para obtener ms informacin. Qu alimentos debo evitar? Frutas Fruta enlatada en almbar liviano o espeso. Frutas cocidas en aceite. Frutas con salsa de crema o mantequilla. Verduras Verduras con crema o fritas. Verduras en salsa de queso. Verduras enlatadas regulares (que no sean con bajo contenido de sodio o reducidas en sodio). Pasta y salsa de tomates enlatadas regulares (que no sean con bajo contenido de sodio o reducidas en sodio). Jugos de tomate y verduras regulares (que no sean con bajo contenido de sodio o reducidos en sodio). Pepinillos. Aceitunas. Granos Productos de panificacin hechos con grasa, como medialunas, magdalenas y algunos panes. Comidas con arroz o pasta seca listas para usar. Carnes y otras protenas Cortes de carne con alto contenido de grasa. Costillas. Carne frita. Tocino. Mortadela, salame y otras carnes precocidas o curadas, como embutidos o panes de carne. Grasa de la espalda del cerdo (panceta). Salchicha de cerdo. Frutos secos y semillas con sal. Frijoles   enlatados con agregado de sal. Pescado enlatado o ahumado. Huevos enteros o yemas. Pollo o pavo con piel. Lcteos Leche entera o al 2%, crema y mitad leche y mitad crema. Queso crema entero o con toda su grasa. Yogur entero o endulzado. Quesos con toda su grasa. Sustitutos de cremas no lcteas. Coberturas batidas. Quesos para untar y quesos procesados. Grasas y aceites Mantequilla. Margarina en barra. Manteca de cerdo. Lardo. Mantequilla clarificada. Grasa de panceta. Aceites tropicales como aceite de coco, palmiste o palma. Alios y condimentos Sal de cebolla, sal de  ajo, sal condimentada, sal de mesa y sal marina. Salsa Worcestershire. Salsa trtara. Salsa barbacoa. Salsa teriyaki. Salsa de soja, incluso la que tiene contenido reducido de sodio. Salsa de carne. Salsas en lata y envasadas. Salsa de pescado. Salsa de ostras. Salsa rosada. Rbanos picantes comprados en tiendas. [place]tchup. Mostaza. Saborizantes y tiernizantes para carne. Caldo en cubitos. Salsas picantes. Adobos preelaborados o envasados. Aderezos para tacos preelaborados o envasados. Salsas de pepinillos. Aderezos comunes para ensalada. Otros alimentos Palomitas de maz y pretzels con sal. Es posible que los productos que se enumeran ms arriba no constituyan una lista completa de los alimentos y las bebidas que debe evitar. Consulte a un nutricionista para obtener ms informacin. Dnde buscar ms informacin  National Heart, Lung, and Blood Institute (Instituto Nacional del Corazn, los Pulmones y la Sangre): www.nhlbi.nih.gov  American Heart Association (Asociacin Estadounidense del Corazn): www.heart.org  Academy of Nutrition and Dietetics (Academia de Nutricin y Diettica): www.eatright.org  National Kidney Foundation (Fundacin Nacional del Rin): www.kidney.org Resumen  El plan de alimentacin DASH ha demostrado bajar la presin arterial elevada (hipertensin). Tambin puede reducir el riesgo de diabetes tipo 2, enfermedad cardaca y accidente cerebrovascular.  Cuando siga el plan de alimentacin DASH, trate de comer ms frutas frescas y verduras, cereales integrales, carnes magras, lcteos descremados y grasas cardiosaludables.  Con el plan de alimentacin DASH, deber limitar el consumo de sal (sodio) a 2,300 mg por da. Si tiene hipertensin, es posible que necesite reducir la ingesta de sodio a 1,500 mg por da.  Trabaje con su mdico o nutricionista para ajustar su plan alimentario a sus necesidades calricas personales. Esta informacin no tiene como fin reemplazar el consejo  del mdico. Asegrese de hacerle al mdico cualquier pregunta que tenga. Document Revised: 07/18/2019 Document Reviewed: 07/18/2019 Elsevier Patient Education  2021 Elsevier Inc.  

## 2020-07-14 NOTE — Progress Notes (Signed)
Subjective:  Patient ID: Jill Sexton, female    DOB: 06-24-1991  Age: 30 y.o. MRN: [id number]  CC:  Chief Complaint  Patient presents with  . Hypertension     HPI Jill Sexton is a 30 year old female who presents today to establish care.  Medical history significant for hypertension.  She endorses compliance with amlodipine and has no adverse effects from this medication.  She does not exercise regularly and is not compliant with a low-sodium diet. She has no additional concerns today.  Past Medical History:  Diagnosis Date  . Chronic hypertension    on amlodipine  . Missed abortion     Past Surgical History:  Procedure Laterality Date  . DILATION AND EVACUATION N/A 10/30/2018   Procedure: DILATATION AND EVACUATION;  Surgeon: Woodroe Mode, MD;  Location: [facility];  Service: Gynecology;  Laterality: N/A;  . NO PAST SURGERIES      Family History  Problem Relation Age of Onset  . Hypertension Mother     No Known Allergies  Outpatient Medications Prior to Visit  Medication Sig Dispense Refill  . Ascorbic Acid (VITAMIN C) 100 MG tablet Take 100 mg by mouth daily.    Marland Kitchen ibuprofen (ADVIL) 600 MG tablet Take 1 tablet (600 mg total) by mouth every 8 (eight) hours as needed for mild pain. (Patient not taking: Reported on 10/15/2019) 30 tablet 0  . Prenat-Fe Poly-Methfol-FA-DHA (VITAFOL ULTRA) 29-0.6-0.4-200 MG CAPS Take 1 tablet by mouth daily. (Patient not taking: Reported on 08/07/2019) 30 capsule 12  . Prenatal Vit-Fe Fumarate-FA (PRENATAL VITAMINS PO) Take 1 tablet by mouth daily.    Marland Kitchen amLODipine (NORVASC) 5 MG tablet Take 1 tablet (5 mg total) by mouth daily. 90 tablet 0   No facility-administered medications prior to visit.     ROS Review of Systems  Constitutional: Negative for activity change, appetite change and fatigue.  HENT: Negative for congestion, sinus pressure and sore throat.   Eyes: Negative for visual disturbance.  Respiratory: Negative  for cough, chest tightness, shortness of breath and wheezing.   Cardiovascular: Negative for chest pain and palpitations.  Gastrointestinal: Negative for abdominal distention, abdominal pain and constipation.  Endocrine: Negative for polydipsia.  Genitourinary: Negative for dysuria and frequency.  Musculoskeletal: Negative for arthralgias and back pain.  Skin: Negative for rash.  Neurological: Negative for tremors, light-headedness and numbness.  Hematological: Does not bruise/bleed easily.  Psychiatric/Behavioral: Negative for agitation and behavioral problems.    Objective:  BP 136/87 (BP Location: Right Arm, Patient Position: Sitting, Cuff Size: Normal)   Pulse 76   Temp 98.8 F (37.1 C) (Oral)   Resp 18   Ht 4' 11.5" (1.511 m)   Wt 158 lb (71.7 kg)   LMP 06/23/2020   SpO2 100%   BMI 31.38 kg/m   BP/Weight 07/14/2020 04/14/2020 0/73/7106  Systolic BP 269 485 462  Diastolic BP 87 84 88  Wt. (Lbs) 158 - -  BMI 31.38 - -      Physical Exam Constitutional:      Appearance: She is well-developed.  Neck:     Vascular: No JVD.  Cardiovascular:     Rate and Rhythm: Normal rate.     Heart sounds: Normal heart sounds. No murmur heard.   Pulmonary:     Effort: Pulmonary effort is normal.     Breath sounds: Normal breath sounds. No wheezing or rales.  Chest:     Chest wall: No tenderness.  Abdominal:  General: Bowel sounds are normal. There is no distension.     Palpations: Abdomen is soft. There is no mass.     Tenderness: There is no abdominal tenderness.  Musculoskeletal:        General: Normal range of motion.     Right lower leg: No edema.     Left lower leg: No edema.  Neurological:     Mental Status: She is alert and oriented to person, place, and time.  Psychiatric:        Mood and Affect: Mood normal.     CMP Latest Ref Rng & Units 10/05/2019 03/26/2019 07/16/2015  Glucose 70 - 99 mg/dL 89 85 99  BUN 6 - 20 mg/dL 9 9 9   Creatinine 0.44 - 1.00 mg/dL  0.50 0.50(L) 0.53  Sodium 135 - 145 mmol/L 137 136 136  Potassium 3.5 - 5.1 mmol/L 3.8 3.9 3.9  Chloride 98 - 111 mmol/L 105 104 104  CO2 22 - 32 mmol/L 20(L) 20 21(L)  Calcium 8.9 - 10.3 mg/dL 9.0 9.1 9.1  Total Protein 6.5 - 8.1 g/dL 7.0 6.9 6.4(L)  Total Bilirubin 0.3 - 1.2 mg/dL 0.5 <0.2 0.4  Alkaline Phos 38 - 126 U/L 230(H) 62 268(H)  AST 15 - 41 U/L 16 12 22   ALT 0 - 44 U/L 14 11 14     Lipid Panel  No results found for: CHOL, TRIG, HDL, CHOLHDL, VLDL, LDLCALC, LDLDIRECT  CBC    Component Value Date/Time   WBC 10.4 10/05/2019 0051   RBC 4.19 10/05/2019 0051   HGB 12.0 10/05/2019 0051   HGB 11.2 08/01/2019 0849   HCT 36.1 10/05/2019 0051   HCT 33.9 (L) 08/01/2019 0849   PLT 194 10/05/2019 0051   PLT 211 08/01/2019 0849   MCV 86.2 10/05/2019 0051   MCV 86 08/01/2019 0849   MCH 28.6 10/05/2019 0051   MCHC 33.2 10/05/2019 0051   RDW 14.1 10/05/2019 0051   RDW 12.7 08/01/2019 0849   LYMPHSABS 2.1 03/26/2019 1244   MONOABS 0.7 10/26/2018 1943   EOSABS 0.1 03/26/2019 1244   BASOSABS 0.0 03/26/2019 1244    Lab Results  Component Value Date   HGBA1C 5.3 03/26/2019    Assessment & Plan:  1. Essential hypertension Controlled Counseled on blood pressure goal of less than 130/80, low-sodium, DASH diet, medication compliance, 150 minutes of moderate intensity exercise per week. Discussed medication compliance, adverse effects. - amLODipine (NORVASC) 5 MG tablet; Take 1 tablet (5 mg total) by mouth daily.  Dispense: 30 tablet; Refill: 6 - Basic Metabolic Panel  2. Need for hepatitis C screening test - HCV RNA quant rflx ultra or genotyp([facility])   Health Care Maintenance: Up-to-date Meds ordered this encounter  Medications  . amLODipine (NORVASC) 5 MG tablet    Sig: Take 1 tablet (5 mg total) by mouth daily.    Dispense:  30 tablet    Refill:  6    Follow-up: Return in about 6 months (around 01/11/2021) for Hypertension.       Charlott Rakes, MD,  FAAFP. [facility] and [place], [place]   07/14/2020, 2:51 PM

## 2020-07-16 LAB — BASIC METABOLIC PANEL
BUN/Creatinine Ratio: 30 — ABNORMAL HIGH (ref 9–23)
BUN: 15 mg/dL (ref 6–20)
CO2: 25 mmol/L (ref 20–29)
Calcium: 9.8 mg/dL (ref 8.7–10.2)
Chloride: 104 mmol/L (ref 96–106)
Creatinine, Ser: 0.5 mg/dL — ABNORMAL LOW (ref 0.57–1.00)
GFR calc Af Amer: 153 mL/min/{1.73_m2} (ref >59)
GFR calc non Af Amer: 133 mL/min/{1.73_m2} (ref >59)
Glucose: 107 mg/dL — ABNORMAL HIGH (ref 65–99)
Potassium: 4.2 mmol/L (ref 3.5–5.2)
Sodium: 141 mmol/L (ref 134–144)

## 2020-07-16 LAB — HCV RNA QUANT RFLX ULTRA OR GENOTYP: HCV Quant Baseline: NOT DETECTED IU/mL

## 2020-07-17 ENCOUNTER — Telehealth: Payer: Self-pay

## 2020-07-17 NOTE — Telephone Encounter (Signed)
-----   Message from Charlott Rakes, MD sent at 07/16/2020 10:23 AM EST ----- Please inform the patient that labs are normal. Thank you.

## 2020-07-17 NOTE — Telephone Encounter (Signed)
Patient was called and a voicemail was left informing patient to return phone call for lab results. 

## 2021-01-26 ENCOUNTER — Other Ambulatory Visit: Payer: Self-pay | Admitting: Family Medicine

## 2021-01-26 DIAGNOSIS — I10 Essential (primary) hypertension: Secondary | ICD-10-CM

## 2021-01-26 NOTE — Telephone Encounter (Signed)
Using [facility] [id number]. Patient called and advised last OV noted to return for a follow up in 6 months around 01/11/21. She says they told her to return in August, so she was waiting to call and schedule. I advised I can schedule if she would like, she agreed. Appointment scheduled for Tuesday, 03/16/21 at 1610 with Geryl Rankins, NP, advised refills will be sent to [facility].

## 2021-03-16 ENCOUNTER — Other Ambulatory Visit: Payer: Self-pay

## 2021-03-16 ENCOUNTER — Ambulatory Visit: Payer: Self-pay | Attending: Nurse Practitioner | Admitting: Nurse Practitioner

## 2021-03-16 ENCOUNTER — Encounter: Payer: Self-pay | Admitting: Nurse Practitioner

## 2021-03-16 VITALS — BP 122/80 | HR 68 | Ht 59.5 in | Wt 155.5 lb

## 2021-03-16 DIAGNOSIS — Z1159 Encounter for screening for other viral diseases: Secondary | ICD-10-CM

## 2021-03-16 DIAGNOSIS — E669 Obesity, unspecified: Secondary | ICD-10-CM

## 2021-03-16 DIAGNOSIS — Z130 Encounter for screening for diseases of the blood and blood-forming organs and certain disorders involving the immune mechanism: Secondary | ICD-10-CM

## 2021-03-16 DIAGNOSIS — I10 Essential (primary) hypertension: Secondary | ICD-10-CM

## 2021-03-16 DIAGNOSIS — D229 Melanocytic nevi, unspecified: Secondary | ICD-10-CM

## 2021-03-16 MED ORDER — AMLODIPINE BESYLATE 5 MG PO TABS: 5.0000 mg | ORAL_TABLET | Freq: Every day | ORAL | 1 refills | Status: DC

## 2021-03-16 NOTE — Progress Notes (Signed)
Jill Sexton [phone]

## 2021-03-16 NOTE — Progress Notes (Signed)
Assessment & Plan:  Jill Sexton was seen today for hypertension.  Diagnoses and all orders for this visit:  Primary hypertension -     Thyroid Panel With TSH -     CMP14+EGFR -     amLODipine (NORVASC) 5 MG tablet; Take 1 tablet (5 mg total) by mouth daily. Continue all antihypertensives as prescribed.  Remember to bring in your blood pressure log with you for your follow up appointment.  DASH/Mediterranean Diets are healthier choices for HTN.    Obesity (BMI 30-39.9) -     Thyroid Panel With TSH Discussed diet and exercise for person with BMI >30. Instructed: You must burn more calories than you eat. Losing 5 percent of your body weight should be considered a success. In the longer term, losing more than 15 percent of your body weight and staying at this weight is an extremely good result. However, keep in mind that even losing 5 percent of your body weight leads to important health benefits, so try not to get discouraged if you're not able to lose more than this.   Need for hepatitis C screening test -     HCV Ab w Reflex to Quant PCR  Screening for deficiency anemia -     CBC  Atypical mole -     Ambulatory referral to Dermatology  Other orders -     Interpretation:   Patient has been counseled on age-appropriate routine health concerns for screening and prevention. These are reviewed and up-to-date. Referrals have been placed accordingly. Immunizations are up-to-date or declined.    Subjective:   Chief Complaint  Patient presents with   Hypertension   HPI Jill Sexton 30 y.o. female presents to office today for HTN follow up VRI was used to communicate directly with patient for the entire encounter including providing detailed patient instructions.    Atypical Mole on right cheek. Has increased in size and changed in texture and color. Associated symptoms: itching and pain  HTN Blood pressure is well controlled with amlodipine 5 mg daily. Denies chest pain, shortness of  breath, palpitations, lightheadedness, dizziness, headaches or BLE edema.    BP Readings from Last 3 Encounters:  03/16/21 122/80  07/14/20 136/87  04/14/20 121/84     Review of Systems  Constitutional:  Negative for fever, malaise/fatigue and weight loss.  HENT: Negative.  Negative for nosebleeds.   Eyes: Negative.  Negative for blurred vision, double vision and photophobia.  Respiratory: Negative.  Negative for cough and shortness of breath.   Cardiovascular: Negative.  Negative for chest pain, palpitations and leg swelling.  Gastrointestinal: Negative.  Negative for heartburn, nausea and vomiting.  Musculoskeletal: Negative.  Negative for myalgias.  Skin:        SEE HPI  Neurological: Negative.  Negative for dizziness, focal weakness, seizures and headaches.  Psychiatric/Behavioral: Negative.  Negative for suicidal ideas.    Past Medical History:  Diagnosis Date   Chronic hypertension    on amlodipine   Missed abortion     Past Surgical History:  Procedure Laterality Date   DILATION AND EVACUATION N/A 10/30/2018   Procedure: DILATATION AND EVACUATION;  Surgeon: Woodroe Mode, MD;  Location: [facility];  Service: Gynecology;  Laterality: N/A;   NO PAST SURGERIES      Family History  Problem Relation Age of Onset   Hypertension Mother     Social History Reviewed with no changes to be made today.   Outpatient Medications Prior to Visit  Medication Sig Dispense Refill   Ascorbic Acid (VITAMIN C) 100 MG tablet Take 100 mg by mouth daily.     ibuprofen (ADVIL) 600 MG tablet Take 1 tablet (600 mg total) by mouth every 8 (eight) hours as needed for mild pain. 30 tablet 0   Prenat-Fe Poly-Methfol-FA-DHA (VITAFOL ULTRA) 29-0.6-0.4-200 MG CAPS Take 1 tablet by mouth daily. 30 capsule 12   Prenatal Vit-Fe Fumarate-FA (PRENATAL VITAMINS PO) Take 1 tablet by mouth daily.     amLODipine (NORVASC) 5 MG tablet TAKE 1 TABLET(5 MG) BY MOUTH DAILY 30 tablet 1   No  facility-administered medications prior to visit.    No Known Allergies     Objective:    BP 122/80   Pulse 68   Ht 4' 11.5" (1.511 m)   Wt 155 lb 8 oz (70.5 kg)   LMP 03/11/2021 (Exact Date)   SpO2 99%   BMI 30.88 kg/m  Wt Readings from Last 3 Encounters:  03/16/21 155 lb 8 oz (70.5 kg)  07/14/20 158 lb (71.7 kg)  10/15/19 170 lb 1.6 oz (77.2 kg)    Physical Exam Vitals and nursing note reviewed.  Constitutional:      Appearance: She is well-developed.  HENT:     Head: Normocephalic and atraumatic.   Cardiovascular:     Rate and Rhythm: Normal rate and regular rhythm.     Heart sounds: Normal heart sounds. No murmur heard.   No friction rub. No gallop.  Pulmonary:     Effort: Pulmonary effort is normal. No tachypnea or respiratory distress.     Breath sounds: Normal breath sounds. No decreased breath sounds, wheezing, rhonchi or rales.  Chest:     Chest wall: No tenderness.  Abdominal:     General: Bowel sounds are normal.     Palpations: Abdomen is soft.  Musculoskeletal:        General: Normal range of motion.     Cervical back: Normal range of motion.  Skin:    General: Skin is warm and dry.  Neurological:     Mental Status: She is alert and oriented to person, place, and time.     Coordination: Coordination normal.  Psychiatric:        Behavior: Behavior normal. Behavior is cooperative.        Thought Content: Thought content normal.        Judgment: Judgment normal.         Patient has been counseled extensively about nutrition and exercise as well as the importance of adherence with medications and regular follow-up. The patient was given clear instructions to go to ER or return to medical center if symptoms don't improve, worsen or new problems develop. The patient verbalized understanding.   Follow-up: Return in about 3 months (around 06/15/2021).   Gildardo Pounds, FNP-BC [facility] and [facility] [place],  [place]   03/21/2021, 4:49 PM

## 2021-03-17 LAB — CMP14+EGFR
ALT: 14 IU/L (ref 0–32)
AST: 14 IU/L (ref 0–40)
Albumin/Globulin Ratio: 1.7 (ref 1.2–2.2)
Albumin: 4.7 g/dL (ref 3.9–5.0)
Alkaline Phosphatase: 110 IU/L (ref 44–121)
BUN/Creatinine Ratio: 23 (ref 9–23)
BUN: 16 mg/dL (ref 6–20)
Bilirubin Total: 0.3 mg/dL (ref 0.0–1.2)
CO2: 21 mmol/L (ref 20–29)
Calcium: 9.4 mg/dL (ref 8.7–10.2)
Chloride: 101 mmol/L (ref 96–106)
Creatinine, Ser: 0.69 mg/dL (ref 0.57–1.00)
Globulin, Total: 2.8 g/dL (ref 1.5–4.5)
Glucose: 79 mg/dL (ref 65–99)
Potassium: 4.3 mmol/L (ref 3.5–5.2)
Sodium: 142 mmol/L (ref 134–144)
Total Protein: 7.5 g/dL (ref 6.0–8.5)
eGFR: 121 mL/min/{1.73_m2} (ref >59)

## 2021-03-17 LAB — THYROID PANEL WITH TSH
Free Thyroxine Index: 2.3 (ref 1.2–4.9)
T3 Uptake Ratio: 27 % (ref 24–39)
T4, Total: 8.5 ug/dL (ref 4.5–12.0)
TSH: 0.443 u[IU]/mL — ABNORMAL LOW (ref 0.450–4.500)

## 2021-03-17 LAB — HCV AB W REFLEX TO QUANT PCR: HCV Ab: <0.1 s/co ratio (ref 0.0–0.9)

## 2021-03-17 LAB — CBC
Hematocrit: 39.6 % (ref 34.0–46.6)
Hemoglobin: 13.3 g/dL (ref 11.1–15.9)
MCH: 29.1 pg (ref 26.6–33.0)
MCHC: 33.6 g/dL (ref 31.5–35.7)
MCV: 87 fL (ref 79–97)
Platelets: 299 10*3/uL (ref 150–450)
RBC: 4.57 x10E6/uL (ref 3.77–5.28)
RDW: 12.9 % (ref 11.7–15.4)
WBC: 8.5 10*3/uL (ref 3.4–10.8)

## 2021-03-17 LAB — HCV INTERPRETATION

## 2021-03-21 ENCOUNTER — Encounter: Payer: Self-pay | Admitting: Nurse Practitioner

## 2021-04-17 IMAGING — US US FETAL BPP W/ NON-STRESS
1 series · 10 of 10 positions shown · non-contrast
Comparison: none

[Series 1: us fetal bpp w/nonstress · 10 acquisitions, 10 frames shown]
[im 1/10]
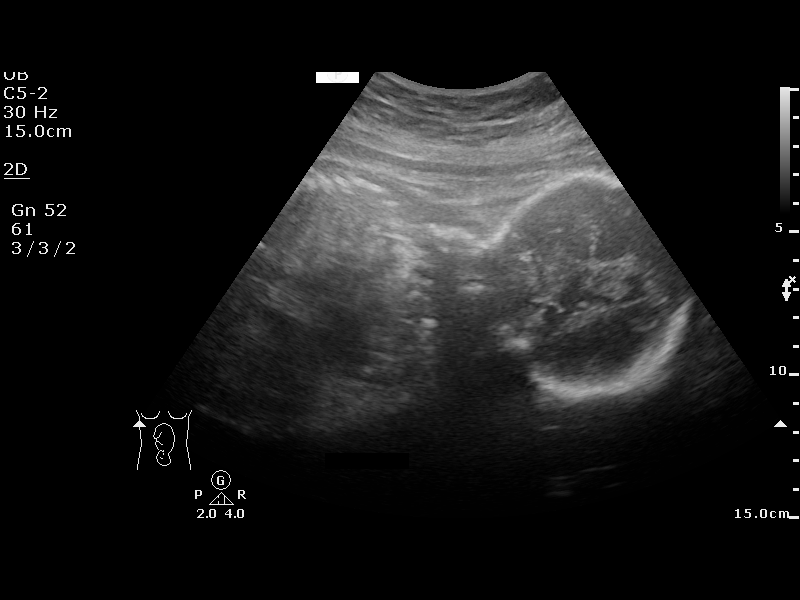
[im 2/10]
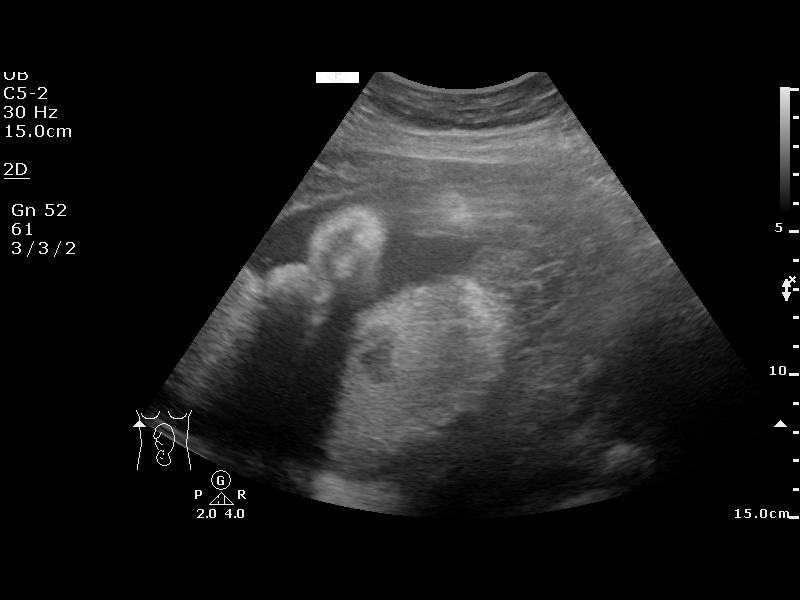
[im 3/10]
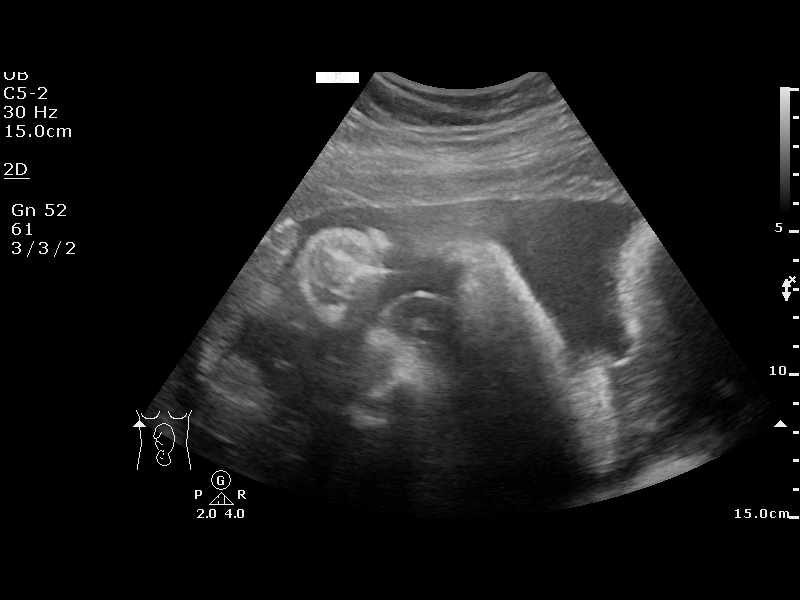
[im 4/10]
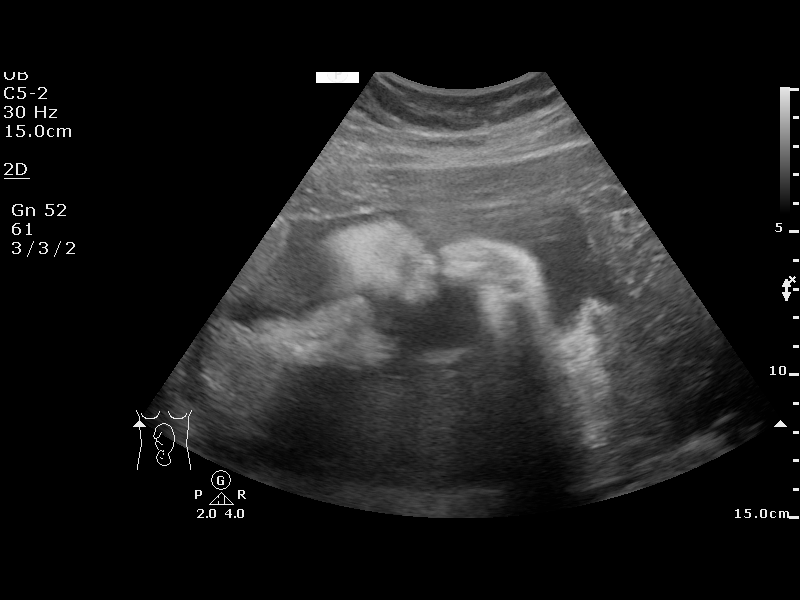
[im 5/10]
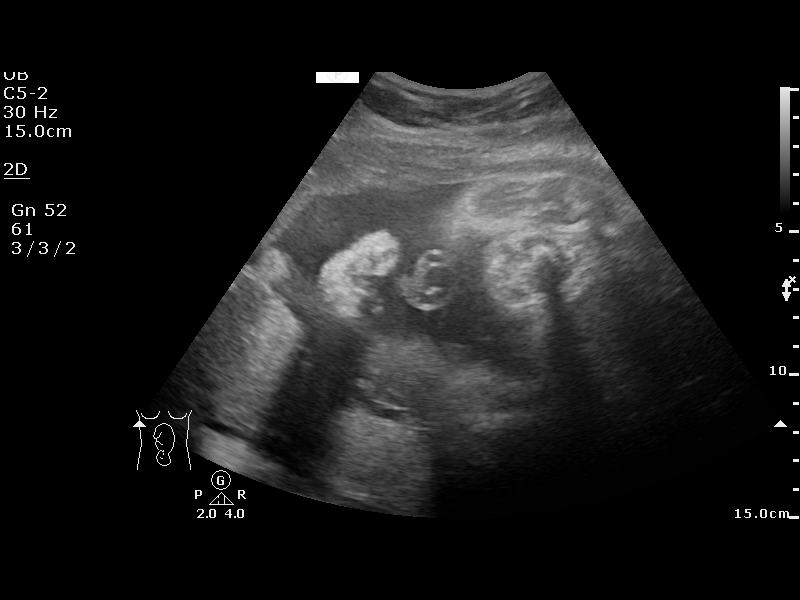
[im 6/10]
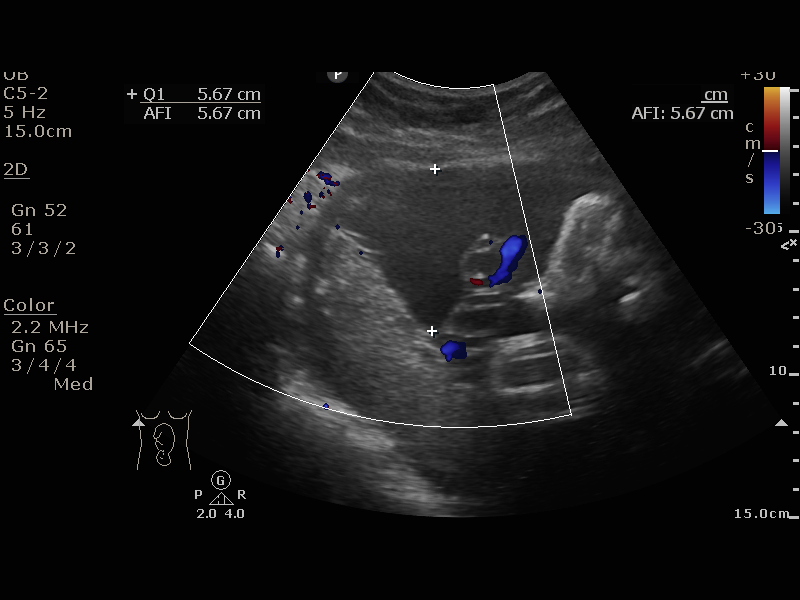
[im 7/10]
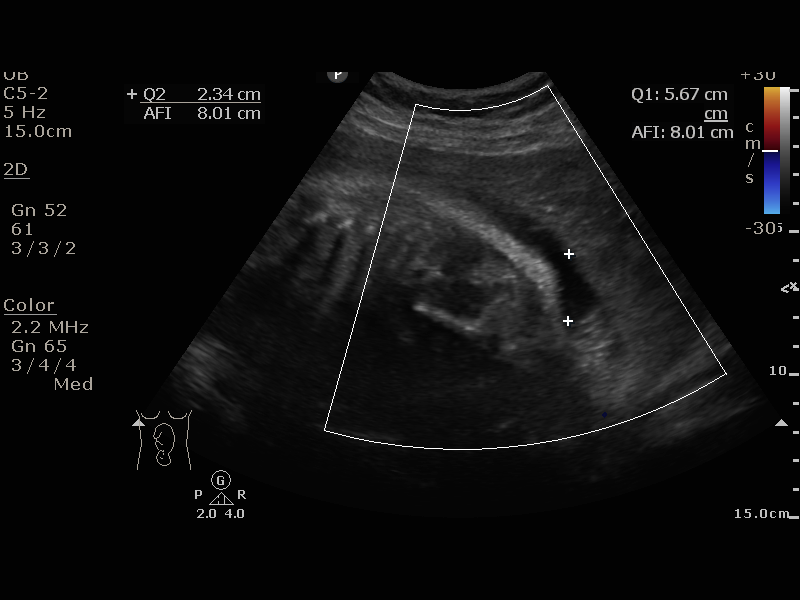
[im 8/10]
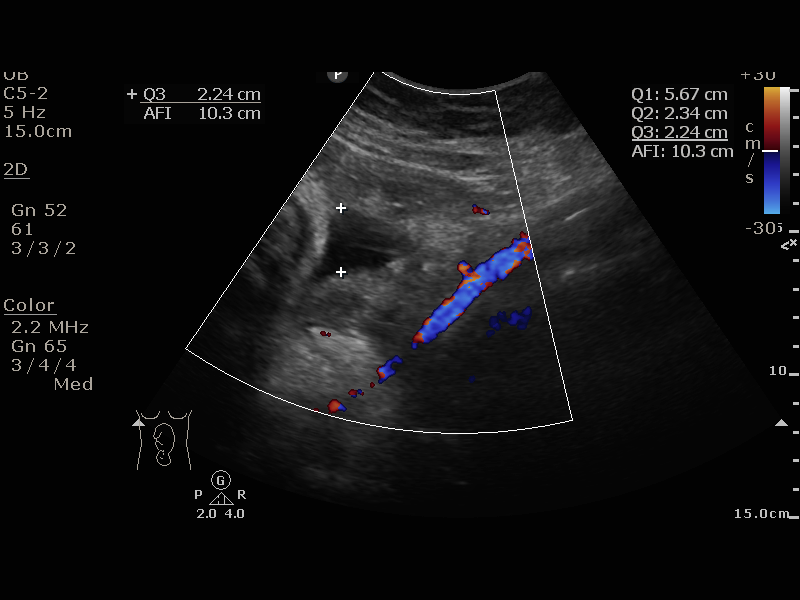
[im 9/10]
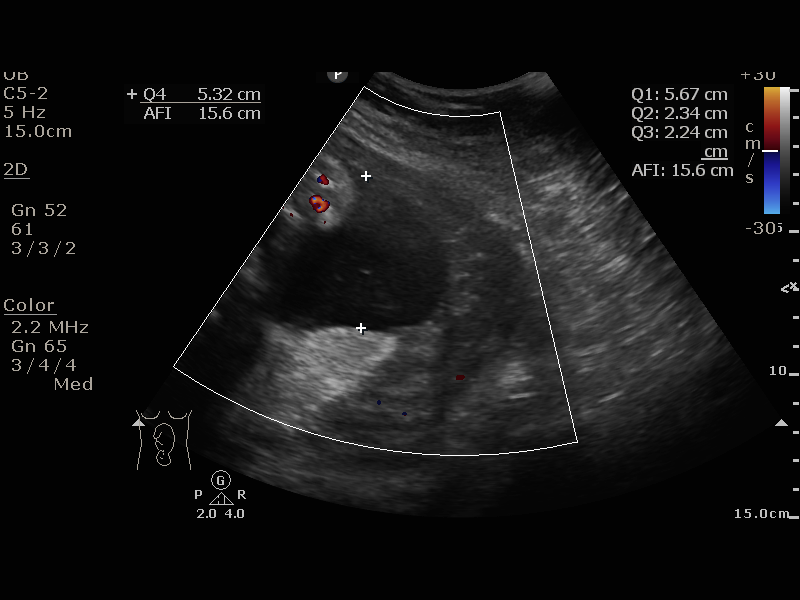
[im 10/10]
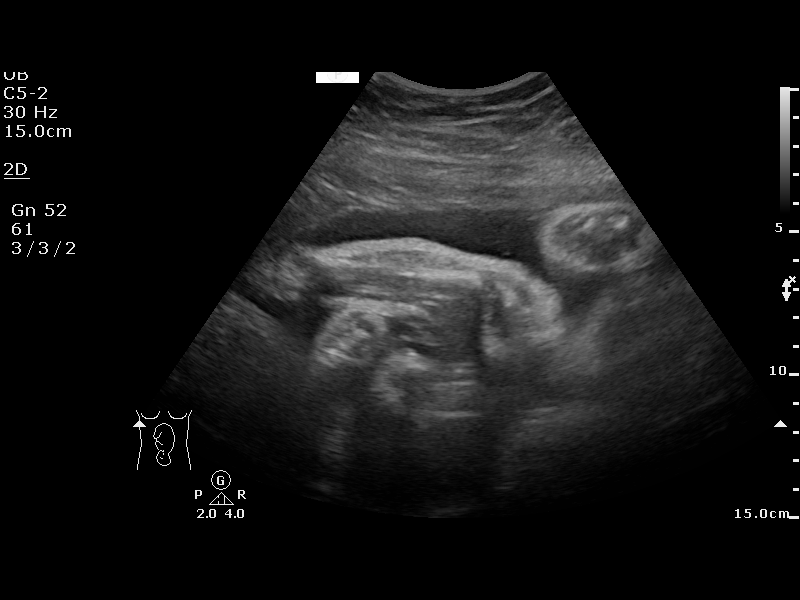

[10 of 10 positions shown; findings below may reference images not displayed]

[address]
                                                            Women's
                                                            [REDACTED]

  1  US FETAL BPP W/NONSTRESS             76818.4      ISTIAK AHMED PERSON
 ----------------------------------------------------------------------

 ----------------------------------------------------------------------
Service(s) Provided

 ----------------------------------------------------------------------
Indications

  32 weeks gestation of pregnancy
  Hypertension - Chronic/Pre-existing
 ----------------------------------------------------------------------
Fetal Evaluation

 Num Of Fetuses:         1
 Preg. Location:         Intrauterine
 Cardiac Activity:       Observed
 Presentation:           Cephalic

 Amniotic Fluid
 AFI FV:      Within normal limits

 AFI Sum(cm)     %Tile       Largest Pocket(cm)
 15.57           55

 RUQ(cm)       RLQ(cm)       LUQ(cm)        LLQ(cm)

Biophysical Evaluation

 Amniotic F.V:   Pocket => 2 cm             F. Tone:        Observed
 F. Movement:    Observed                   N.S.T:          Reactive
 F. Breathing:   Not Observed               Score:          [DATE]
OB History

 Blood Type:    O-
 Gravidity:    3         Term:   1        Prem:   0        SAB:   1
 TOP:          0       Ectopic:  0        Living: 1
Gestational Age

 LMP:           32w 3d        Date:  01/11/19                 EDD:   10/18/19
 Best:          32w 3d     Det. By:  LMP  (01/11/19)          EDD:   10/18/19
Impression

 Reassuring antenatal testing
Recommendations

 Continue with weekly testing.
                Fuster, Johnathan

## 2021-04-24 IMAGING — US US MFM OB FOLLOW-UP
1 series · 14 of 28 positions shown · non-contrast
Comparison: none

[Series 1: us mfm ob follow-up · 37 acquisitions, 14 frames shown]
[im 2/37]
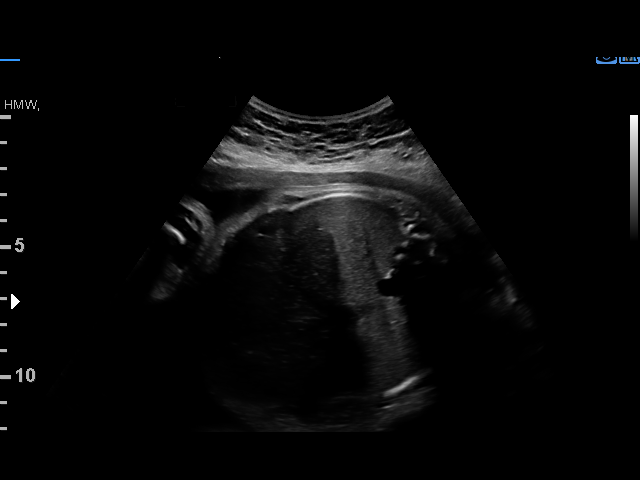
[im 5/37]
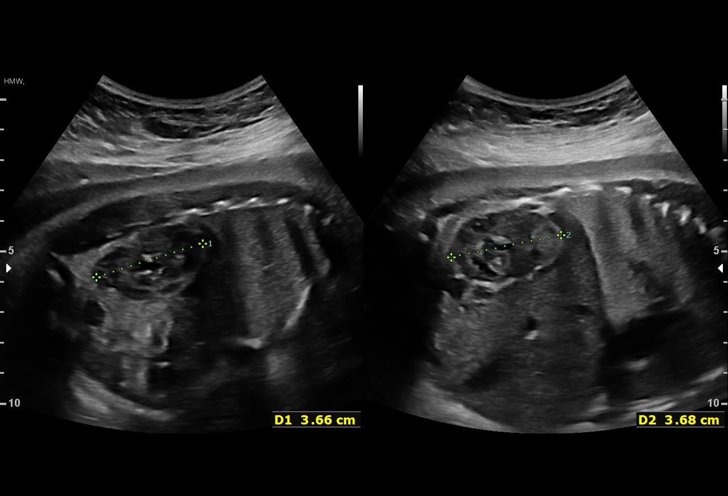
[im 7/37]
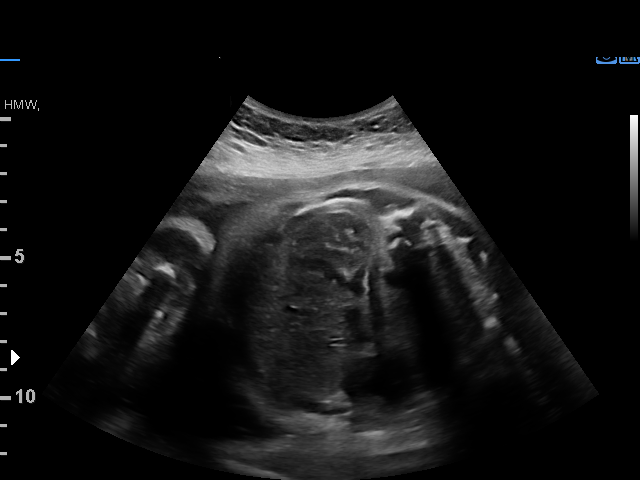
[im 10/37]
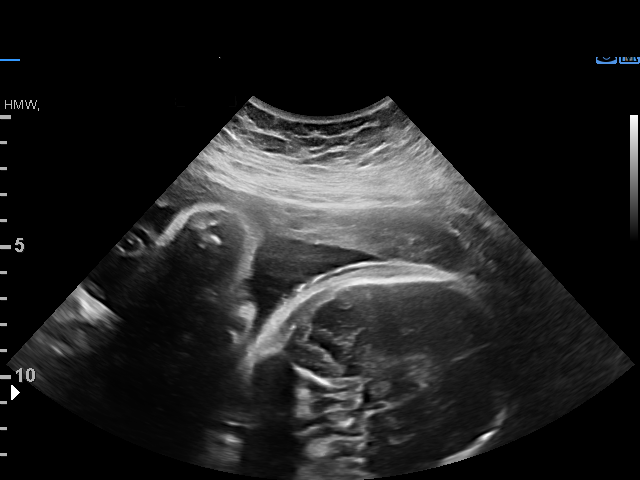
[im 13/37]
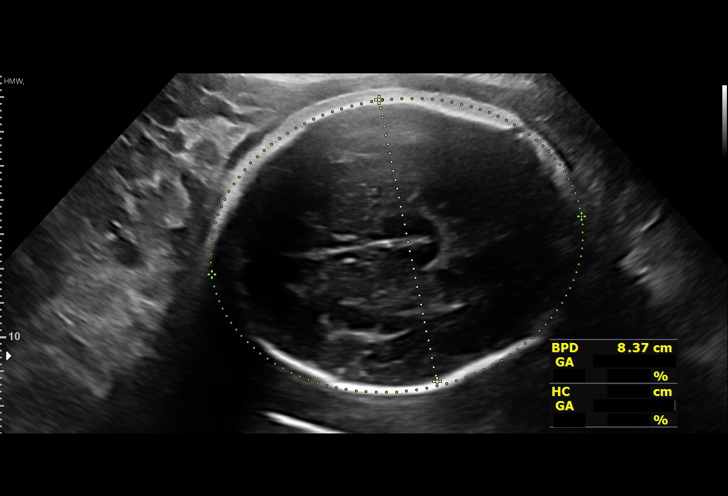
[im 15/37]
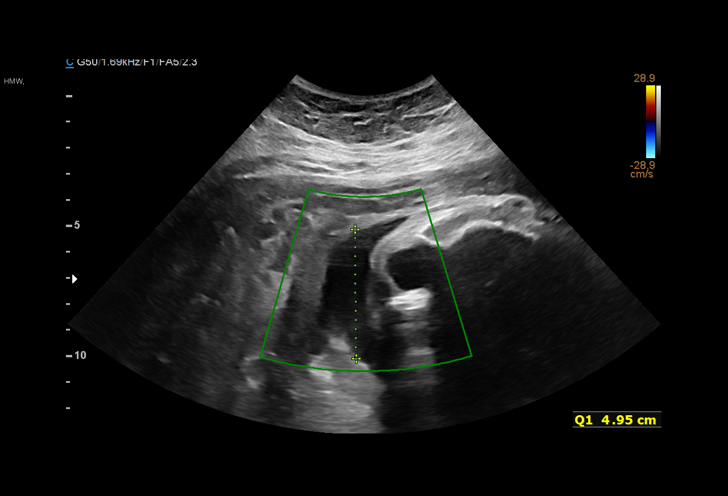
[im 18/37]
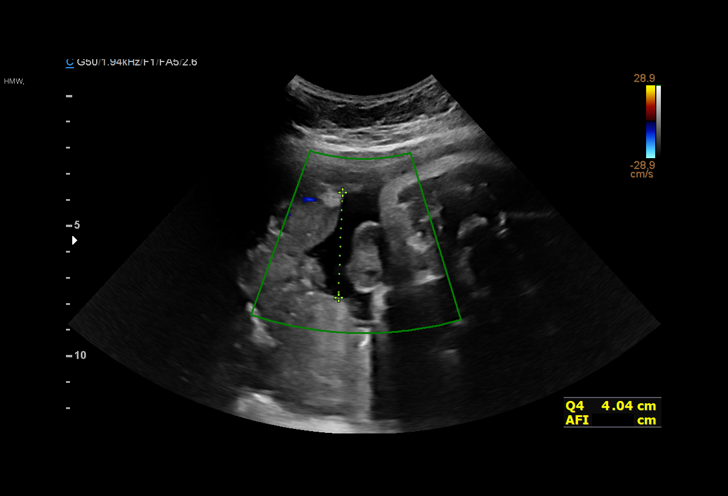
[im 21/37]
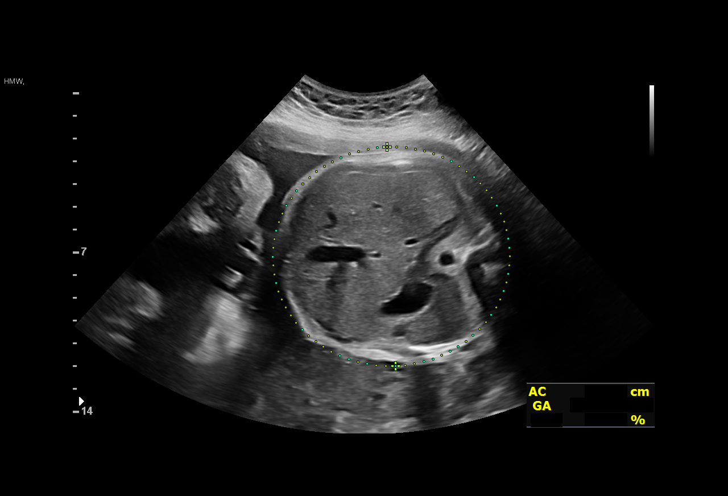
[im 23/37]
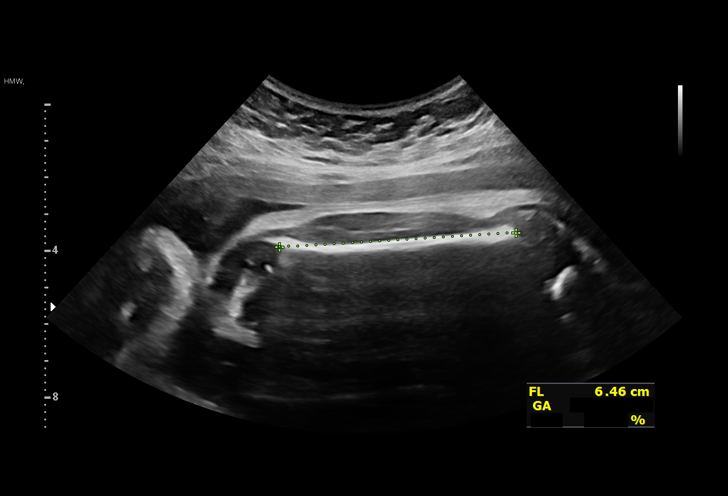
[im 26/37]
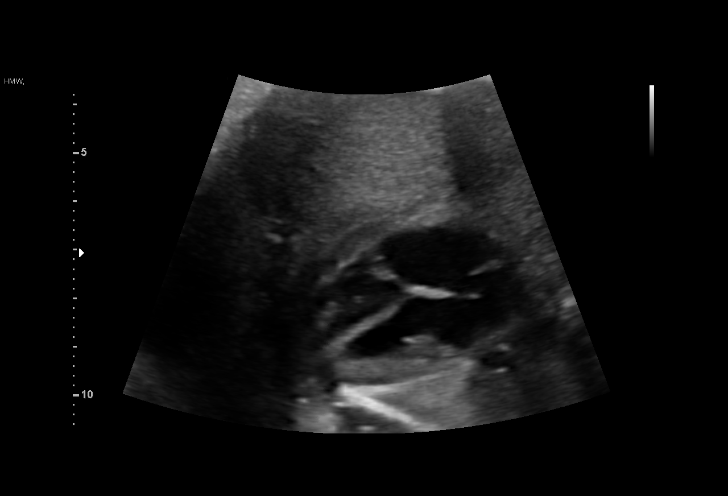
[im 29/37]
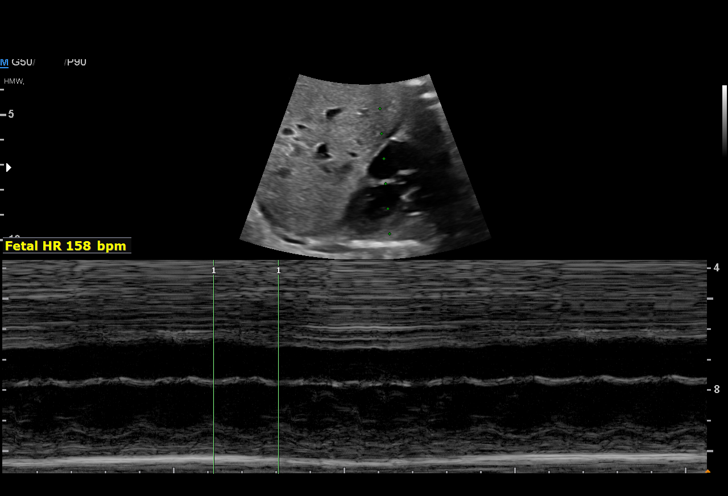
[im 31/37]
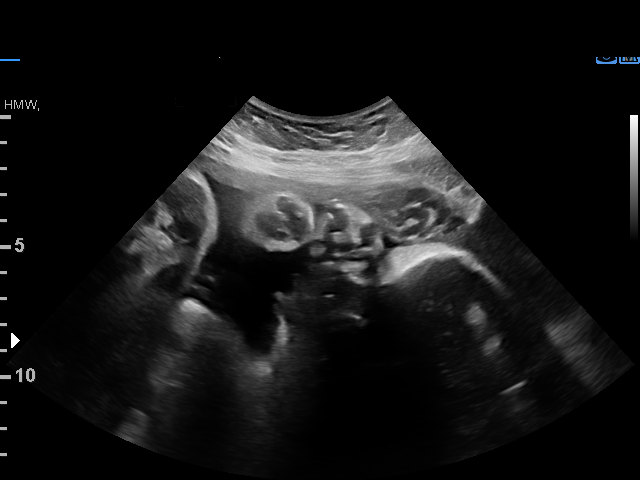
[im 34/37]
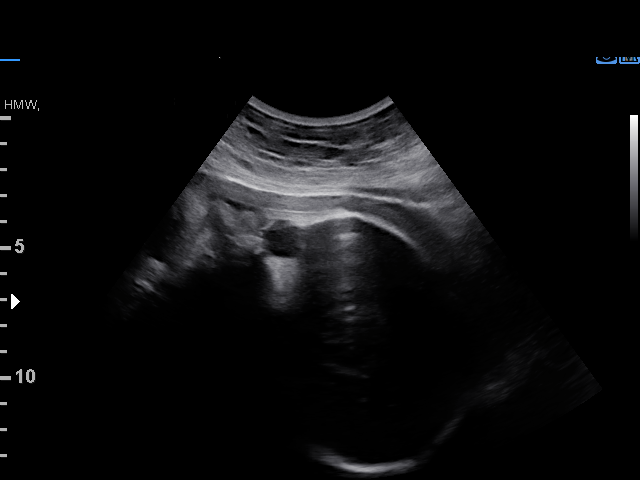
[im 37/37]
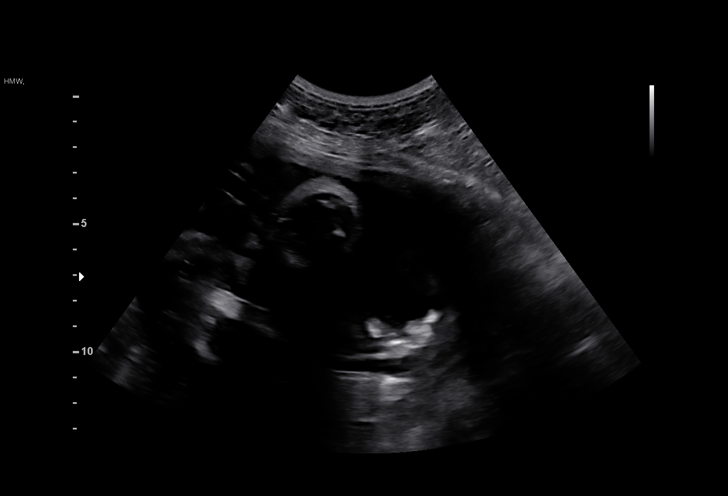

[14 of 28 positions shown; findings below may reference images not displayed]

[address]

 ----------------------------------------------------------------------

 ----------------------------------------------------------------------
Indications

  Hypertension - Chronic/Pre-existing
  (Norvasc and ASA)
  33 weeks gestation of pregnancy
  Rh negative state in antepartum
  Obesity complicating pregnancy, second
  trimester (BMI 33)
  Encounter for other antenatal screening
  follow-up
 ----------------------------------------------------------------------
Fetal Evaluation

 Num Of Fetuses:         1
 Fetal Heart Rate(bpm):  158
 Cardiac Activity:       Observed
 Presentation:           Cephalic
 Placenta:               Posterior Fundal
 P. Cord Insertion:      Previously Visualized

 Amniotic Fluid
 AFI FV:      Within normal limits

 AFI Sum(cm)     %Tile       Largest Pocket(cm)
 13.68           46

 RUQ(cm)       RLQ(cm)       LUQ(cm)        LLQ(cm)

Biophysical Evaluation

 Amniotic F.V:   Within normal limits       F. Tone:        Observed
 F. Movement:    Observed                   Score:          [DATE]
 F. Breathing:   Observed
Biometry

 BPD:      84.1  mm     G. Age:  33w 6d         58  %    CI:         75.9   %    70 - 86
                                                         FL/HC:      21.2   %    19.9 -
 HC:       306   mm     G. Age:  34w 1d         29  %    HC/AC:      0.96        0.96 -
 AC:      318.4  mm     G. Age:  35w 5d         97  %    FL/BPD:     77.2   %    71 - 87
 FL:       64.9  mm     G. Age:  33w 3d         40  %    FL/AC:      20.4   %    20 - 24

 Est. FW:    5856  gm      5 lb 9 oz     82  %
OB History

 Blood Type:    O-
 Gravidity:    3         Term:   1        Prem:   0        SAB:   1
 TOP:          0       Ectopic:  0        Living: 1
Gestational Age

 LMP:           33w 3d        Date:  01/11/19                 EDD:   10/18/19
 U/S Today:     34w 2d                                        EDD:   10/12/19
 Best:          33w 3d     Det. By:  LMP  (01/11/19)          EDD:   10/18/19
Anatomy

 Cranium:               Appears normal         LVOT:                   Appears normal
 Cavum:                 Previously seen        Aortic Arch:            Previously seen
 Ventricles:            Appears normal         Ductal Arch:            Not well visualized
 Choroid Plexus:        Previously seen        Diaphragm:              Previously seen
 Cerebellum:            Previously seen        Stomach:                Appears normal, left
                                                                       sided
 Posterior Fossa:       Previously seen        Abdomen:                Previously seen
 Nuchal Fold:           Previously seen        Abdominal Wall:         Previously seen
 Face:                  Orbits and profile     Cord Vessels:           Previously seen
                        previously seen
 Lips:                  Previously seen        Kidneys:                Appear normal
 Palate:                Not well visualized    Bladder:                Appears normal
 Thoracic:              Appears normal         Spine:                  Previously seen
 Heart:                 Appears normal         Upper Extremities:      Previously seen
                        (4CH, axis, and
                        situs)
 RVOT:                  Appears normal         Lower Extremities:      Previously seen

 Other:  Parents do not wish to know sex of fetus at this time.  Fetus appears
         to be a male. Heels and 5th digit visualized prev. Nasal bone
         visualized prev. Technically difficult due to fetal position.
Cervix Uterus Adnexa

 Cervix
 Not visualized (advanced GA >85wks)

 Uterus
 No abnormality visualized.

 Left Ovary
 Not visualized.

 Right Ovary
 Not visualized.
 Cul De Sac
 No free fluid seen.

 Adnexa
 No abnormality visualized.
Comments

 This patient was seen for a follow up growth scan due to a
 history of chronic hypertension currently treated with Norvasc.
 She denies any problems since her last exam.
 She was informed that the fetal growth and amniotic fluid
 level appears appropriate for her gestational age.
 A biophysical profile performed today was [DATE].
 A follow up growth scan was scheduled in 4 weeks.  Due to
 chronic hypertension, she should continue weekly fetal
 testing until delivery.

## 2021-05-08 IMAGING — US US FETAL BPP W/ NON-STRESS
1 series · 13 of 15 positions shown · non-contrast
Comparison: none

[Series 1: us fetal bpp w/nonstress · 15 acquisitions, 13 frames shown]
[im 1/15]
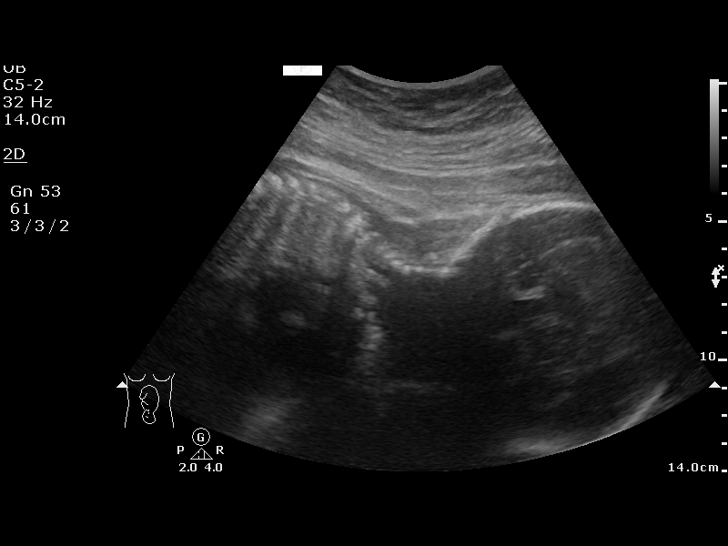
[im 2/15]
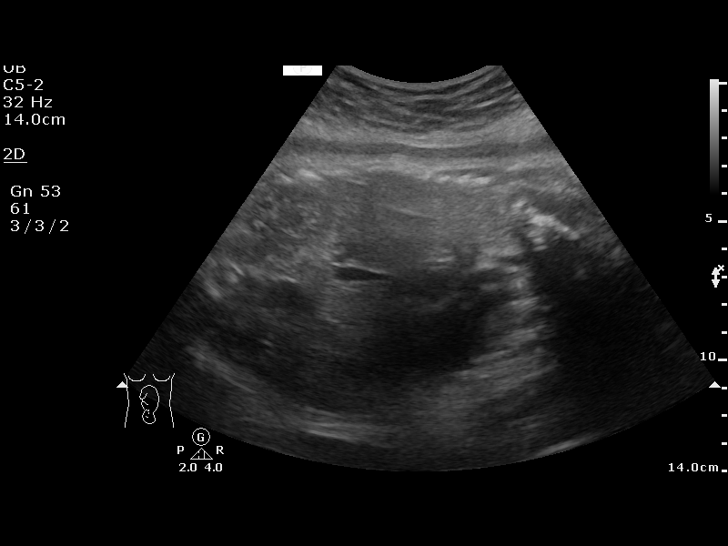
[im 3/15]
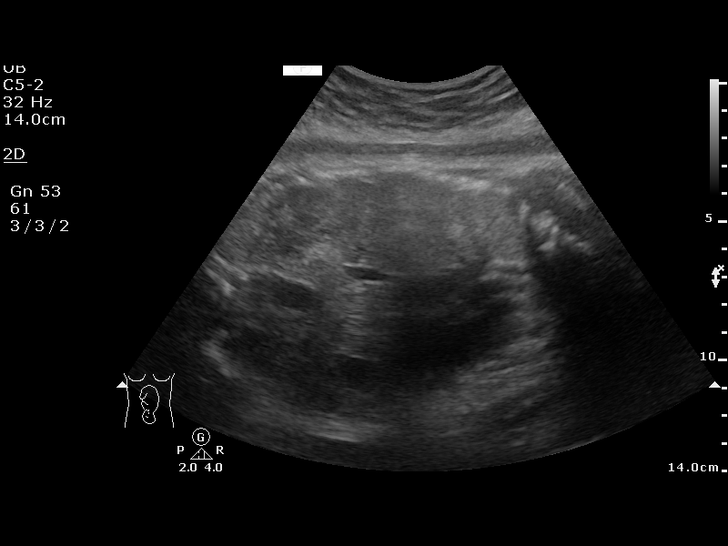
[im 5/15]
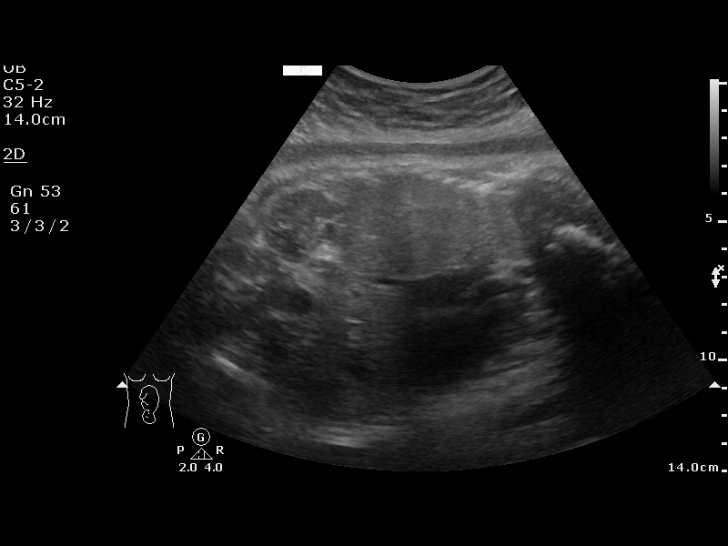
[im 6/15]
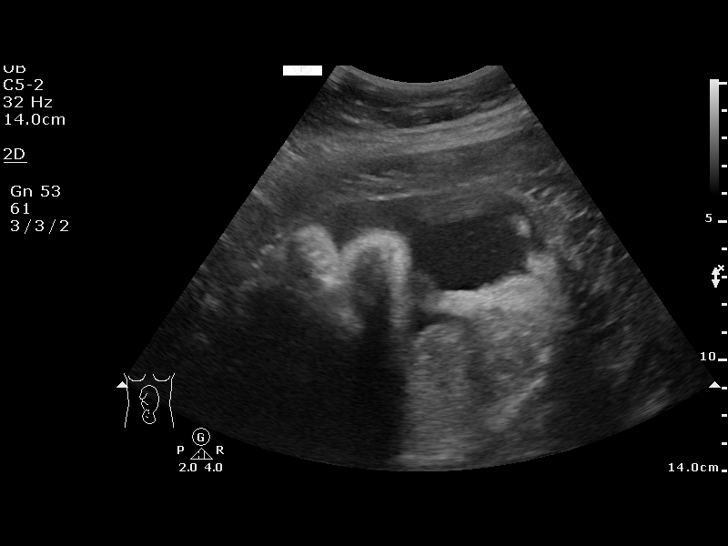
[im 7/15]
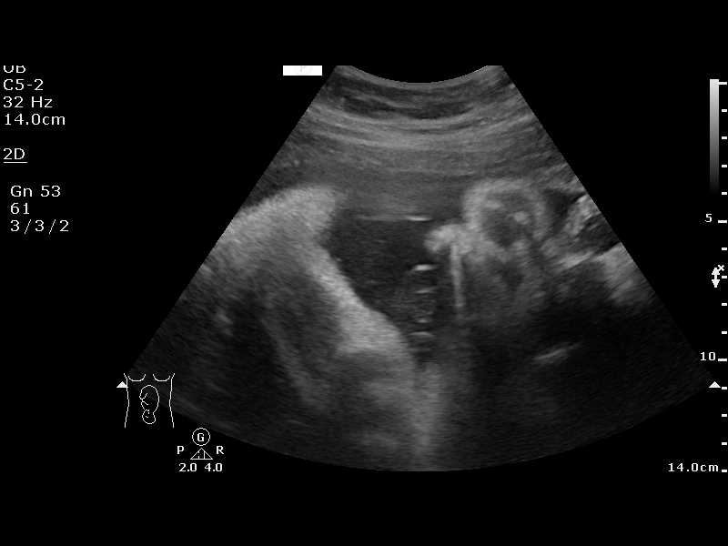
[im 8/15]
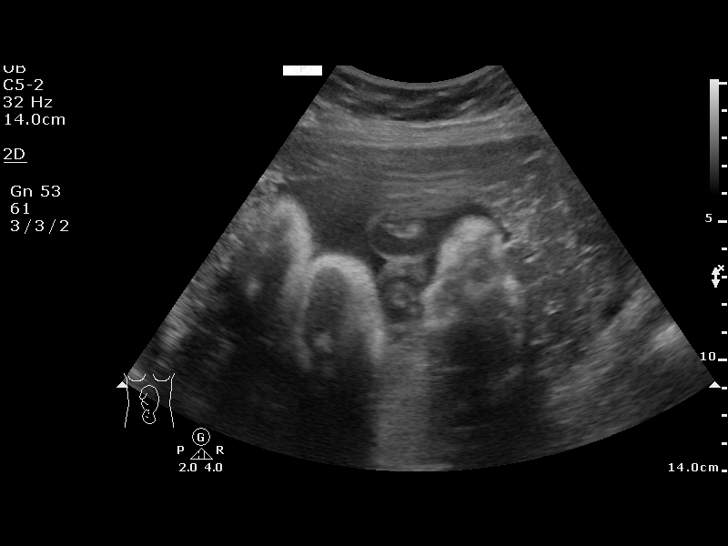
[im 9/15]
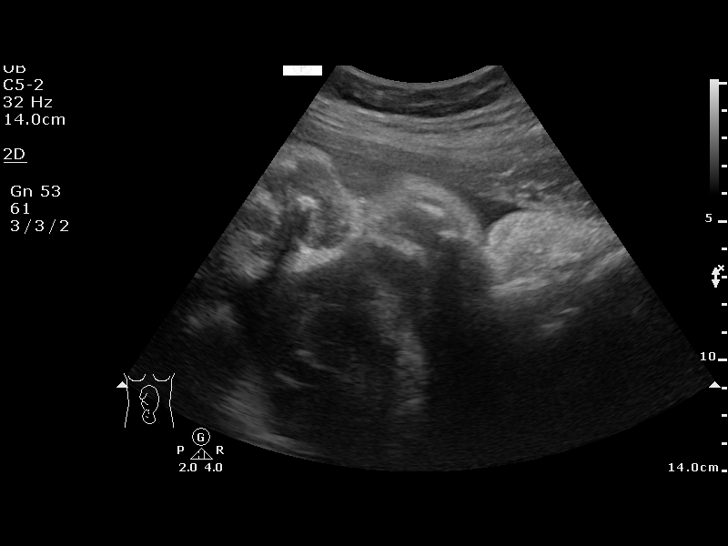
[im 10/15]
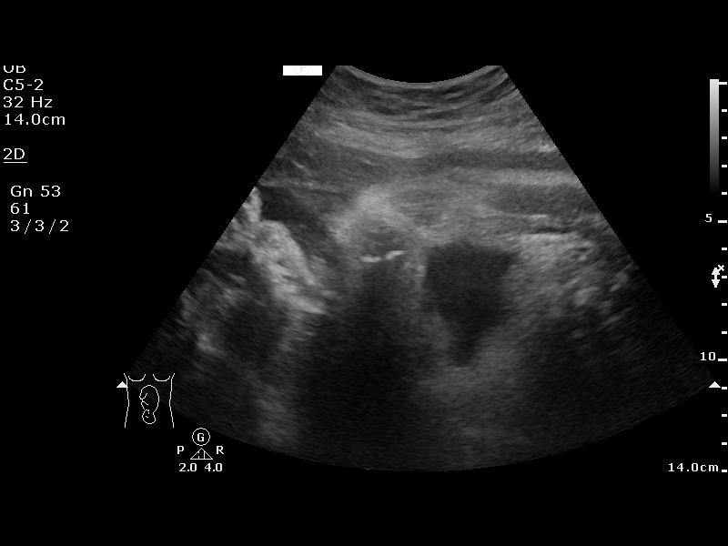
[im 11/15]
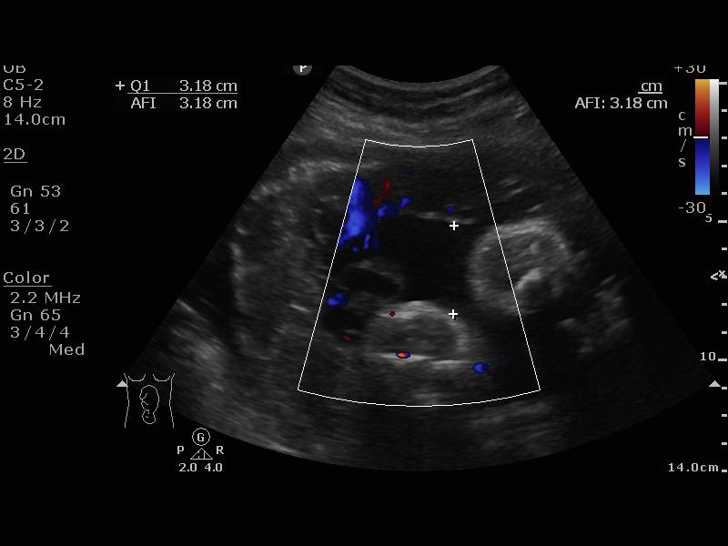
[im 13/15]
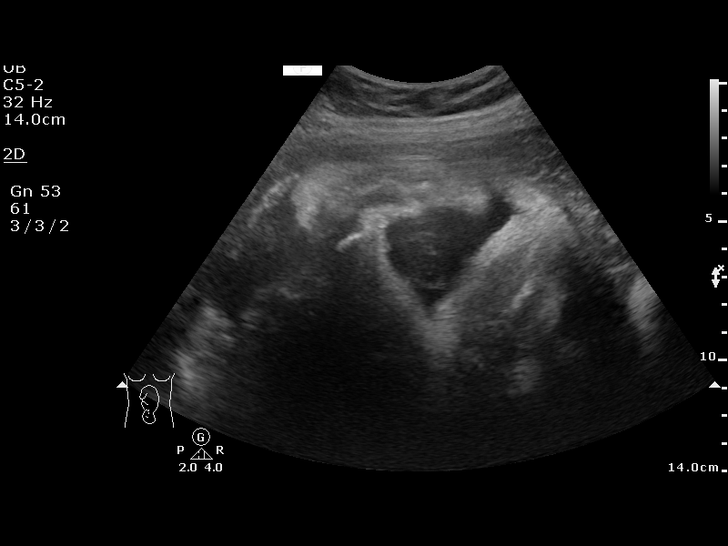
[im 14/15]
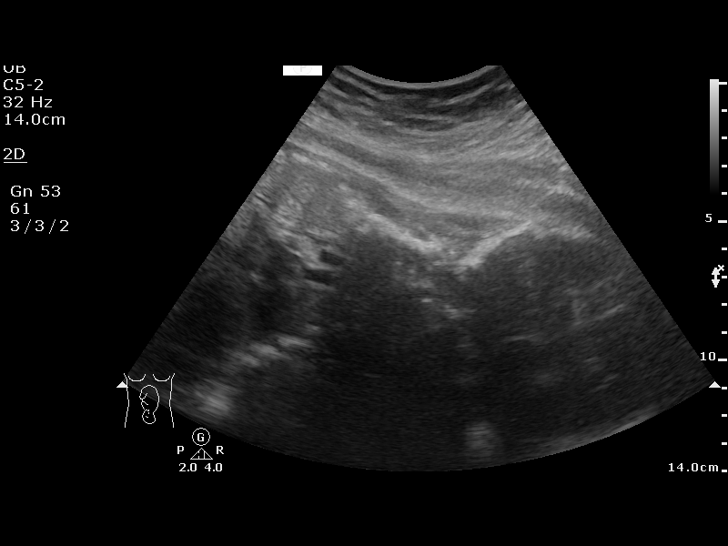
[im 15/15]
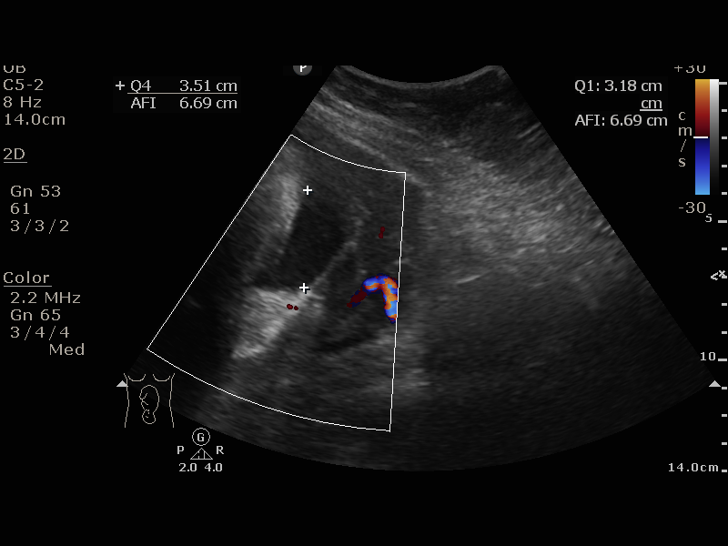

[13 of 15 positions shown; findings below may reference images not displayed]

[address]
                                                            Women's
                                                            [REDACTED]

  1  US FETAL BPP W/NONSTRESS             76818.4      YRENE SIGUENZA
 ----------------------------------------------------------------------

 ----------------------------------------------------------------------
Service(s) Provided

 ----------------------------------------------------------------------
Indications

  35 weeks gestation of pregnancy
 ----------------------------------------------------------------------
Fetal Evaluation

 Num Of Fetuses:         1
 Preg. Location:         Intrauterine
 Cardiac Activity:       Observed
 Presentation:           Cephalic

 Amniotic Fluid
 AFI FV:      Within normal limits

 AFI Sum(cm)     %Tile       Largest Pocket(cm)
 6.69            < 3

 RUQ(cm)       RLQ(cm)       LUQ(cm)        LLQ(cm)
 3.18          3.51          0              0
Biophysical Evaluation

 Amniotic F.V:   Pocket => 2 cm             F. Tone:        Observed
 F. Movement:    Observed                   N.S.T:          Reactive
 F. Breathing:   Observed                   Score:          [DATE]
OB History

 Blood Type:    O-
 Gravidity:    3         Term:   1        Prem:   0        SAB:   1
 TOP:          0       Ectopic:  0        Living: 1
Gestational Age

 LMP:           35w 3d        Date:  01/11/19                 EDD:   10/18/19
 Best:          35w 3d     Det. By:  LMP  (01/11/19)          EDD:   10/18/19
Impression

 BPP [DATE]
Recommendations

 -Continue weekly BPP till delivery.
                  Dolgova, Batyrkhan

## 2021-05-15 IMAGING — US US FETAL BPP W/ NON-STRESS
1 series · 11 of 11 positions shown · non-contrast
Comparison: none

[Series 1: us fetal bpp w/nonstress · 11 acquisitions, 11 frames shown]
[im 1/11]
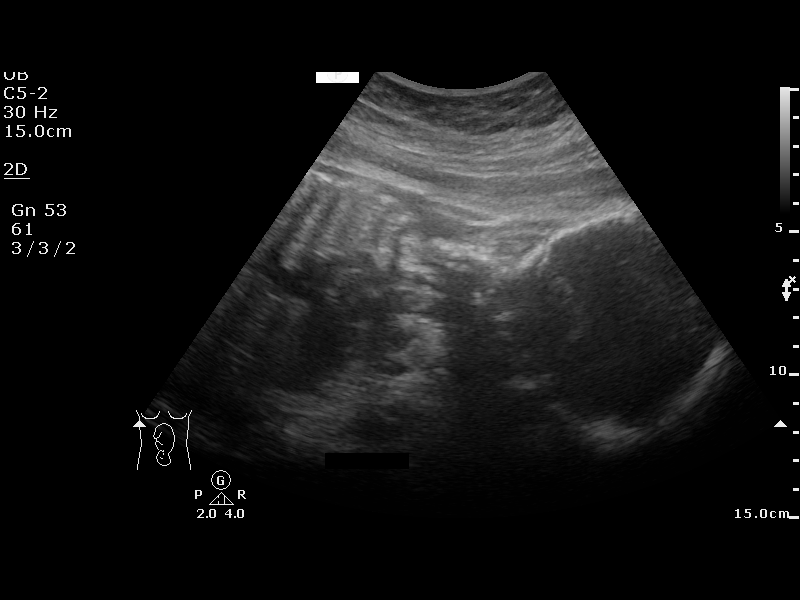
[im 2/11]
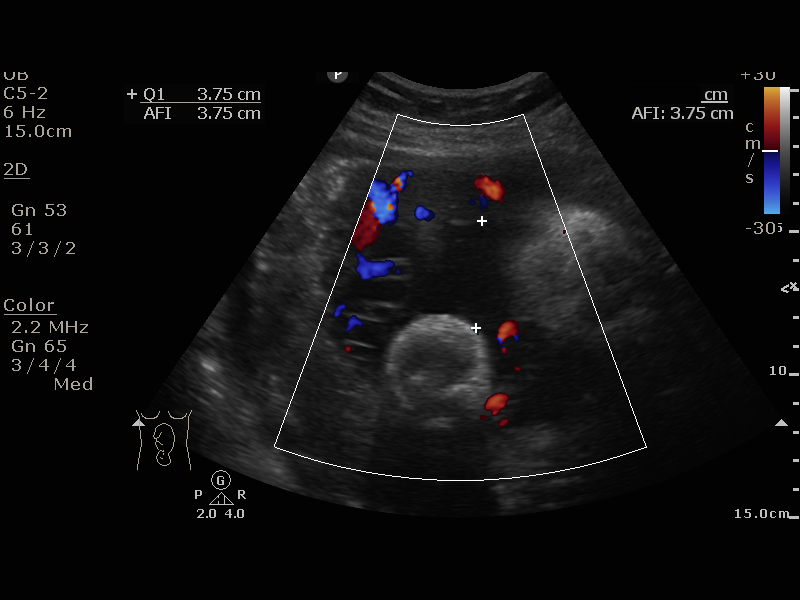
[im 3/11]
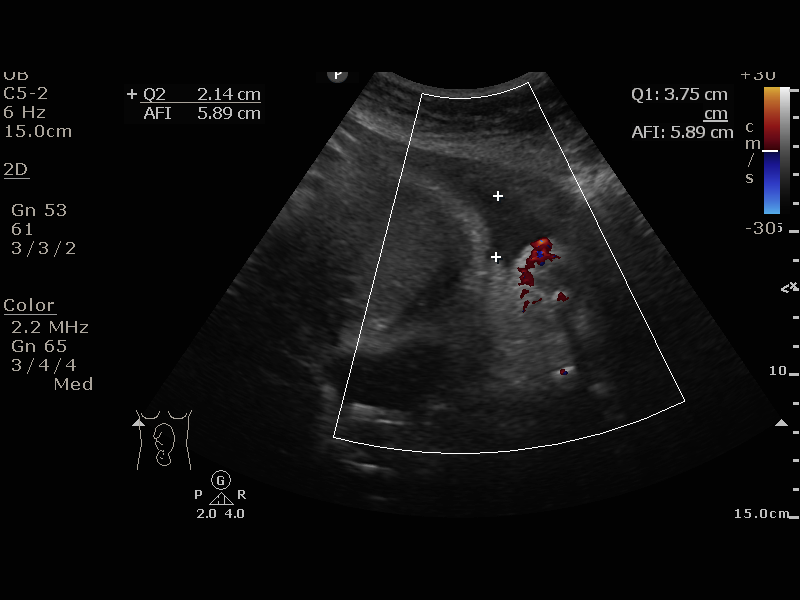
[im 4/11]
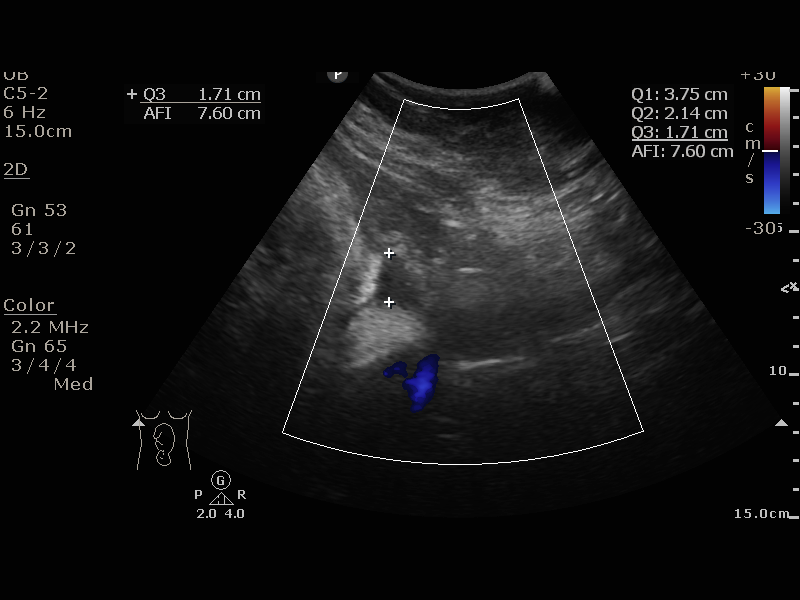
[im 5/11]
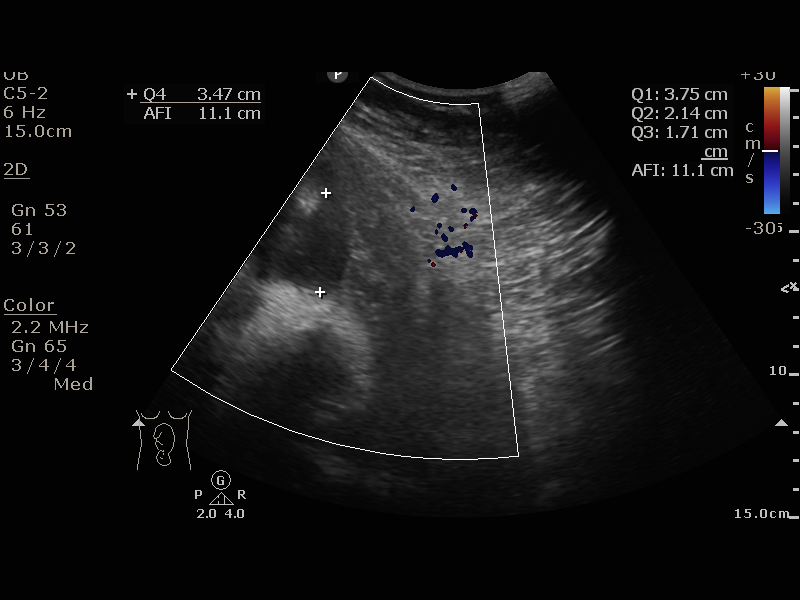
[im 6/11]
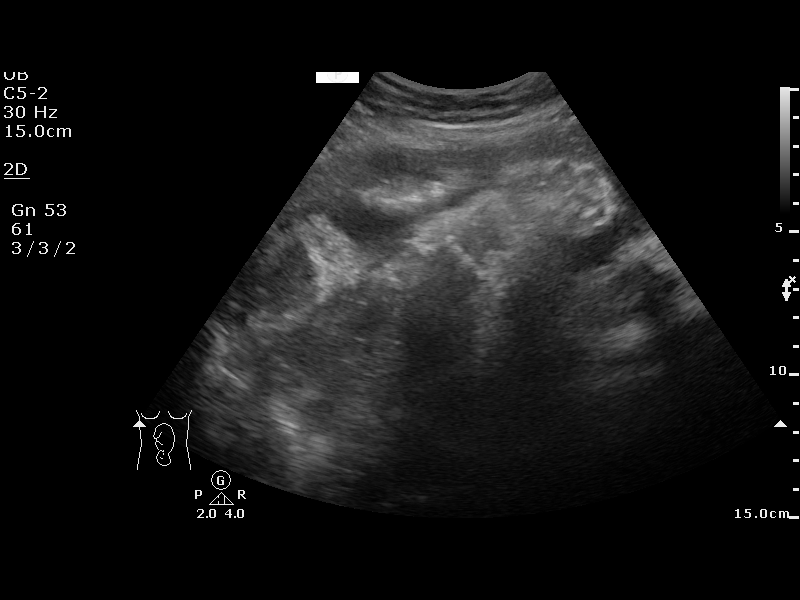
[im 7/11]
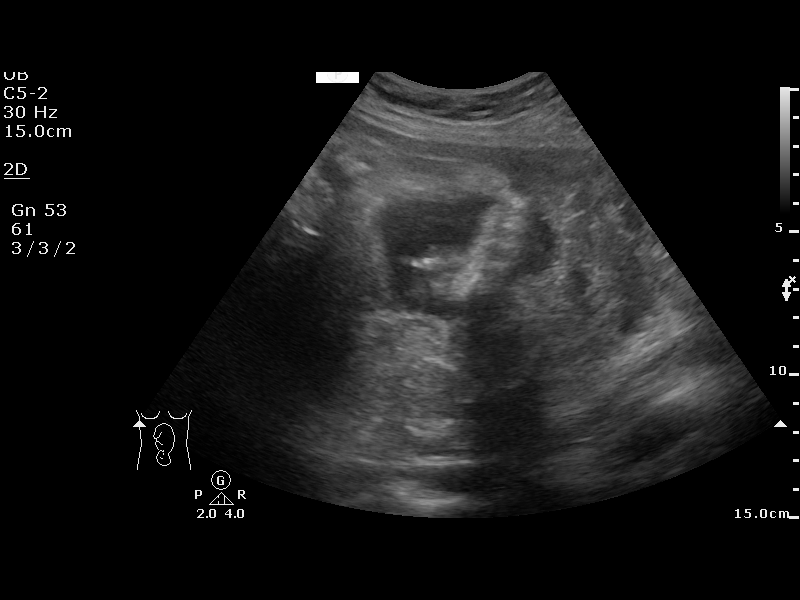
[im 8/11]
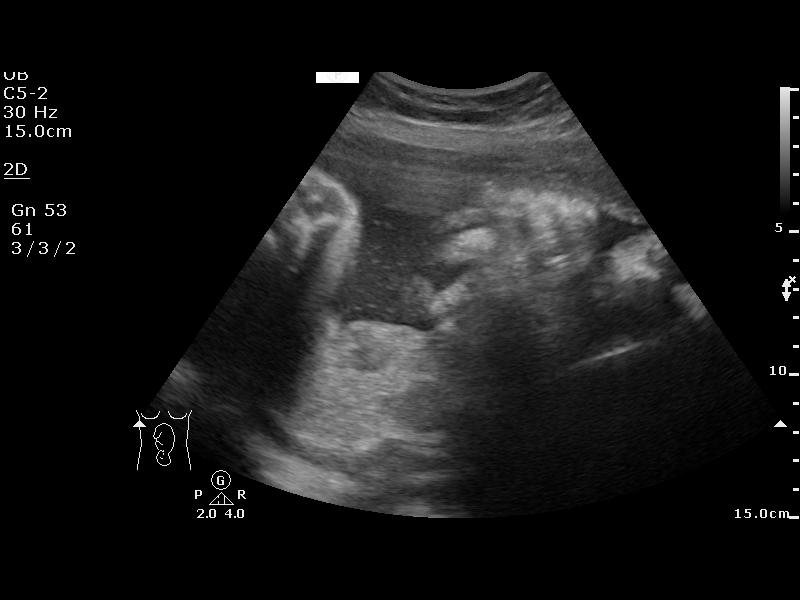
[im 9/11]
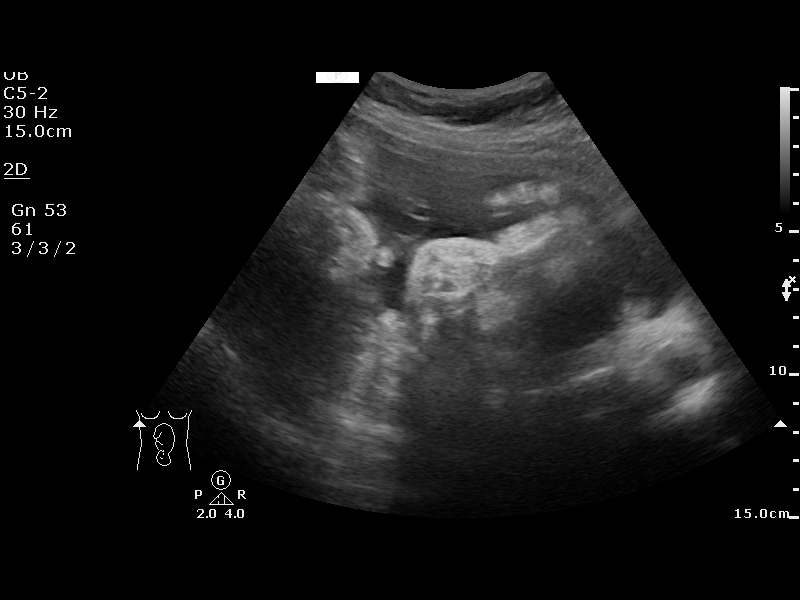
[im 10/11]
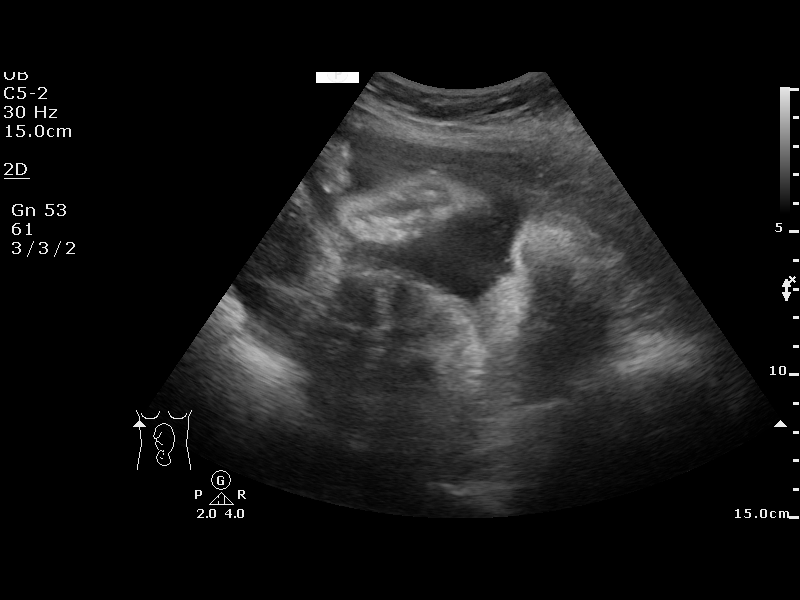
[im 11/11]
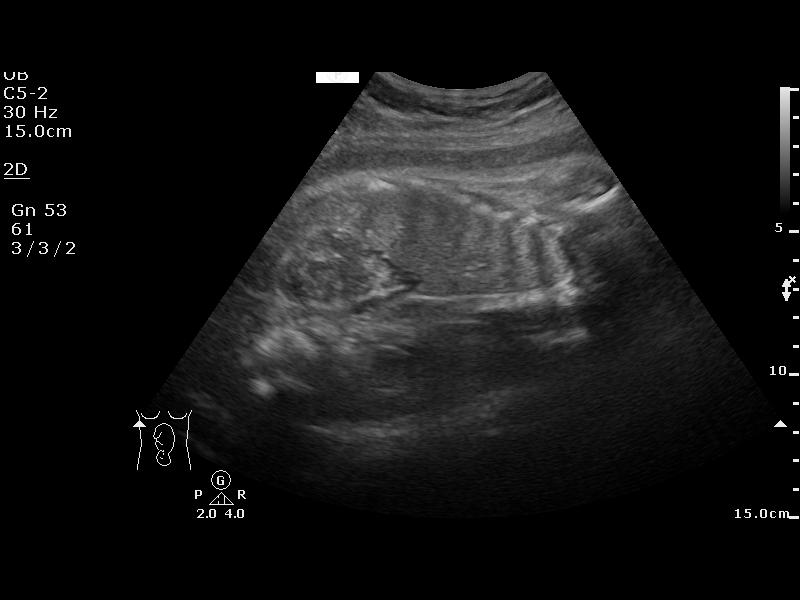

[11 of 11 positions shown; findings below may reference images not displayed]

[address]
                                                            Women's
                                                            [REDACTED]

  1  US FETAL BPP W/NONSTRESS             76818.4      LEAVON BOBBITT
 ----------------------------------------------------------------------

 ----------------------------------------------------------------------
Service(s) Provided

 ----------------------------------------------------------------------
Indications

  36 weeks gestation of pregnancy
  Hypertension - Chronic/Pre-existing
 ----------------------------------------------------------------------
Fetal Evaluation

 Num Of Fetuses:         1
 Preg. Location:         Intrauterine
 Cardiac Activity:       Observed
 Presentation:           Cephalic

 Amniotic Fluid
 AFI FV:      Within normal limits

 AFI Sum(cm)     %Tile       Largest Pocket(cm)
 11.07           31

 RUQ(cm)       RLQ(cm)       LUQ(cm)        LLQ(cm)


 Comment:    Breathing noted intermittently, but not sustained.
Biophysical Evaluation

 Amniotic F.V:   Pocket => 2 cm             F. Tone:        Observed
 F. Movement:    Observed                   N.S.T:          Reactive
 F. Breathing:   Not Observed               Score:          [DATE]
OB History
 Blood Type:    O-
 Gravidity:    3         Term:   1        Prem:   0        SAB:   1
 TOP:          0       Ectopic:  0        Living: 1
Gestational Age

 LMP:           36w 3d        Date:  01/11/19                 EDD:   10/18/19
 Best:          36w 3d     Det. By:  LMP  (01/11/19)          EDD:   10/18/19
Impression

 BPP [DATE], reactive NST
Recommendations

 -Continue weekly BPP till delivery.
                       Heilig, [place]

## 2021-06-14 ENCOUNTER — Ambulatory Visit: Payer: Self-pay | Admitting: Nurse Practitioner

## 2021-06-29 ENCOUNTER — Other Ambulatory Visit: Payer: Self-pay

## 2021-06-29 ENCOUNTER — Encounter: Payer: Self-pay | Admitting: Nurse Practitioner

## 2021-06-29 ENCOUNTER — Ambulatory Visit: Payer: Self-pay | Attending: Nurse Practitioner | Admitting: Nurse Practitioner

## 2021-06-29 VITALS — BP 136/92

## 2021-06-29 DIAGNOSIS — R7989 Other specified abnormal findings of blood chemistry: Secondary | ICD-10-CM

## 2021-06-29 DIAGNOSIS — I10 Essential (primary) hypertension: Secondary | ICD-10-CM

## 2021-06-29 MED ORDER — AMLODIPINE BESYLATE 10 MG PO TABS: 10.0000 mg | ORAL_TABLET | Freq: Every day | ORAL | 1 refills | Status: DC

## 2021-06-29 NOTE — Progress Notes (Signed)
Virtual Visit via Telephone Note Due to national recommendations of social distancing due to Briar 19, telehealth visit is felt to be most appropriate for this patient at this time.  I discussed the limitations, risks, security and privacy concerns of performing an evaluation and management service by telephone and the availability of in person appointments. I also discussed with the patient that there may be a patient responsible charge related to this service. The patient expressed understanding and agreed to proceed.    I connected with Jill Sexton on 06/29/21  at   8:30 AM EST  EDT by telephone and verified that I am speaking with the correct person using two identifiers.  Location of Patient: Private Residence   Location of Provider: [place] participating in Telemedicine visit: Geryl Rankins FNP-BC [place] INTERPRETER [place]   History of Present Illness: Telemedicine visit for: HTN  HOME Blood pressure reading for today's visit was 136/92. She reports similar readings compared to today's. Will increase amlodipine from 5 to 10 mg daily.  BP Readings from Last 3 Encounters:  06/29/21 (!) 136/92  03/16/21 122/80  07/14/20 136/87      Past Medical History:  Diagnosis Date   Chronic hypertension    on amlodipine   Missed abortion     Past Surgical History:  Procedure Laterality Date   DILATION AND EVACUATION N/A 10/30/2018   Procedure: DILATATION AND EVACUATION;  Surgeon: Woodroe Mode, MD;  Location: [place];  Service: Gynecology;  Laterality: N/A;   NO PAST SURGERIES      Family History  Problem Relation Age of Onset   Hypertension Mother     Social History   Socioeconomic History   Marital status: Married    Spouse name: Not on file   Number of children: Not on file   Years of education: Not on file   Highest education level: Not on file  Occupational History   Not on file   Tobacco Use   Smoking status: Never   Smokeless tobacco: Never  Vaping Use   Vaping Use: Never used  Substance and Sexual Activity   Alcohol use: No   Drug use: No   Sexual activity: Yes    Birth control/protection: Condom  Other Topics Concern   Not on file  Social History Narrative   Not on file   Social Determinants of Health   Financial Resource Strain: Not on file  Food Insecurity: Not on file  Transportation Needs: Not on file  Physical Activity: Not on file  Stress: Not on file  Social Connections: Not on file     Observations/Objective: Awake, alert and oriented x 3   Review of Systems  Constitutional:  Negative for fever, malaise/fatigue and weight loss.  HENT: Negative.  Negative for nosebleeds.   Eyes: Negative.  Negative for blurred vision, double vision and photophobia.  Respiratory: Negative.  Negative for cough and shortness of breath.   Cardiovascular: Negative.  Negative for chest pain, palpitations and leg swelling.  Gastrointestinal: Negative.  Negative for heartburn, nausea and vomiting.  Musculoskeletal: Negative.  Negative for myalgias.  Neurological: Negative.  Negative for dizziness, focal weakness, seizures and headaches.  Psychiatric/Behavioral: Negative.  Negative for suicidal ideas.    Assessment and Plan: Diagnoses and all orders for this visit:  Primary hypertension -     amLODipine (NORVASC) 10 MG tablet; Take 1 tablet (10 mg total) by mouth daily. -  CMP14+EGFR Continue all antihypertensives as prescribed.  Remember to bring in your blood pressure log with you for your follow up appointment.  DASH/Mediterranean Diets are healthier choices for HTN.    Low TSH level -     Thyroid Panel With TSH     Follow Up Instructions Return for BP CHECK AND LABS 2-3 weeks. SEE ME IN APRIL FOR PHYSICAL/PAP.     I discussed the assessment and treatment plan with the patient. The patient was provided an opportunity to ask questions and all  were answered. The patient agreed with the plan and demonstrated an understanding of the instructions.   The patient was advised to call back or seek an in-person evaluation if the symptoms worsen or if the condition fails to improve as anticipated.  I provided 13 minutes of non-face-to-face time during this encounter including median intraservice time, reviewing previous notes, labs, imaging, medications and explaining diagnosis and management.  Gildardo Pounds, FNP-BC

## 2021-07-21 ENCOUNTER — Ambulatory Visit: Payer: Self-pay | Admitting: Pharmacist

## 2021-12-22 ENCOUNTER — Ambulatory Visit: Payer: Self-pay | Admitting: Physician Assistant

## 2021-12-22 ENCOUNTER — Encounter: Payer: Self-pay | Admitting: Physician Assistant

## 2021-12-22 ENCOUNTER — Other Ambulatory Visit: Payer: Self-pay

## 2021-12-22 VITALS — BP 134/89 | HR 80 | Wt 140.0 lb

## 2021-12-22 DIAGNOSIS — R7989 Other specified abnormal findings of blood chemistry: Secondary | ICD-10-CM

## 2021-12-22 DIAGNOSIS — I10 Essential (primary) hypertension: Secondary | ICD-10-CM

## 2021-12-22 MED ORDER — AMLODIPINE BESYLATE 5 MG PO TABS
5.0000 mg | ORAL_TABLET | Freq: Every day | ORAL | 2 refills | Status: DC
  Filled 2021-12-22: qty 30, 30d supply, fill #0
  Filled 2022-02-01 – 2022-02-22 (×2): qty 30, 30d supply, fill #1

## 2021-12-22 NOTE — Patient Instructions (Signed)
You are going to restart the amlodipine 5 mg on a daily basis.  I do encourage you to check your blood pressure at home on a daily basis, keep a written log and have available for all office visits.  You will present to [facility] tomorrow morning to have labs completed.  We will call you with the results when they are available.  Jill Rad, PA-C Physician Assistant [facility] [email or web address]   Cmo tomarse la presin arterial How to Take Your Blood Pressure La presin arterial es la medida de la fuerza de la sangre al presionar contra las paredes de las arterias. Las arterias son los vasos sanguneos que transportan la sangre desde el corazn hacia todas las partes del cuerpo. Su mdico toma su presin arterial en cada visita al consultorio. Usted tambin puede tomarse la presin arterial en casa con un tensimetro. Es posible que deba tomarse la presin arterial: Para confirmar un diagnstico de presin arterial elevada (hipertensin). Para vigilar su presin arterial a lo largo del tiempo. Para asegurarse de que el medicamento que toma para la presin arterial est surtiendo Insurance claims handler. Materiales necesarios: Monitor para medir la presin arterial. Una silla para sentarse. Debe ser Ardelia Mems silla en la que pueda sentarse erguido con la espalda apoyada. No se siente en un silln blando o sof. Mesa o escritorio. Cuaderno pequeo y lpiz o bolgrafo. Cmo prepararse Para obtener la lectura ms precisa, evite realizar lo siguiente durante los 30 minutos previos a Chief Technology Officer su presin arterial: Beber cafena. Consumir alcohol. [place]. Fumar. Realizar actividad fsica. Cinco minutos antes de controlar su presin arterial: Vaya al bao y orine para tener la vejiga vaca. Sintese tranquilamente en una silla. No hable. Cmo tomarse la presin arterial Para controlar su presin arterial, siga las  instrucciones presentes en el manual que se incluye con el tensimetro. Si tiene Best boy, las instrucciones podran ser las siguientes: Sintese derecho en una silla. Coloque los pies en el piso. No cruce los tobillos ni las piernas. Apoye su brazo izquierdo al nivel de su corazn en una mesa o escritorio, o en el brazo de la silla. Arremnguese. Envuelva la parte superior de su brazo izquierdo, 1 pulgada (2.5 cm) sobre su codo, con el brazalete. Es mejor Production manager brazalete alrededor de la piel [place] [facility]. Ajuste el brazalete ceidamente, pero no demasiado apretado, alrededor del brazo. Debe poder meter nicamente un dedo entre el brazalete y Water engineer. Coloque el cordn de modo que quede apoyado en el pliegue del codo. Presione el botn de encendido. Permanezca sentado tranquilamente mientras el brazalete se infla y se desinfla. Lea la lectura digital que aparece en la pantalla del tensimetro y anote los nmeros ([place]) en un cuaderno. Espere de 2 a 3 minutos, y luego repita los pasos desde el paso 1. Qu significa mi lectura de presin arterial? Una lectura de la presin arterial consta de un nmero ms alto sobre un nmero ms bajo. En condiciones ideales, la presin arterial debe estar por debajo de 120/80. El primer nmero ("superior") es la presin sistlica. Es la medida de la presin de las arterias cuando el corazn late. El segundo nmero ("inferior") es la presin diastlica. Es la medida de la presin en las arterias cuando el corazn se relaja. La presin arterial se clasifica en cuatro etapas. Las siguientes son las etapas para adultos que no tienen enfermedad grave de corto plazo o una afeccin crnica. La presin sistlica y la presin diastlica se  miden en una unidad llamada mm Hg (milmetros de mercurio).  Normal Presin sistlica: por debajo de 269. Presin diastlica: por debajo de 80. Elevada Presin sistlica: 485-462. Presin diastlica: por debajo de  80. Etapa 1 de hipertensin Presin sistlica: 703-500. Presin diastlica: 93-81. Etapa 2 de hipertensin Presin sistlica: 829 o ms. Presin diastlica: 90 o ms. Puede tener presin arterial elevada o hipertensin incluso si nicamente el nmero sistlico o el diastlico de su lectura es ms elevado de lo normal. Siga estas indicaciones en su casa: Medicamentos Use los medicamentos de venta libre y los recetados solamente como se lo haya indicado el mdico. Dgale a su mdico si tiene efectos secundarios causados por los medicamentos para la presin arterial. Indicaciones generales Mida su presin arterial con la frecuencia recomendada por su mdico. Contrlese la presin arterial a la misma hora todos los [place]. Lleve el tensimetro a su prxima cita con el mdico para asegurarse de lo siguiente: Que lo [place] correctamente. Que genera lecturas precisas. Asegrese de entender cules son sus nmeros objetivo para la presin arterial. Concurra a todas las visitas de seguimiento. Esto es importante. Consejos generales Su mdico puede sugerirle un tensimetro confiable que cumpla con sus necesidades. Existen varios tipos de tensimetros para Corporate investment banker. Escoja un tensimetro que tenga un brazalete. No escoja un tensimetro que mida su presin arterial en la mueca o el dedo. Elija un brazalete que le envuelva ceidamente, pero no ajuste demasiado ni quede muy flojo, la parte superior del brazo. Debe poder meter nicamente un dedo entre el brazalete y Water engineer. Puede comprar un tensimetro en la mayora de las farmacias o en lnea. Dnde obtener ms informacin American Heart Association (Asociacin Estadounidense del Corazn): www.heart.org Comunquese con un mdico si: Su presin arterial es sistemticamente alta. Su presin arterial disminuye repentinamente. Solicite ayuda de inmediato si: Su presin arterial sistlica est por encima de 180. Su presin arterial diastlica est  por encima de 120. Estos sntomas pueden Sales executive. Solicite ayuda de inmediato. Llame al 911. No espere a ver si los sntomas desaparecen. No conduzca por sus propios medios Principal Financial. Resumen La presin arterial es la medida de la fuerza de la sangre al presionar contra las paredes de las arterias. Una lectura de la presin arterial consta de un nmero ms alto sobre un nmero ms bajo. En condiciones ideales, la presin arterial debe estar por debajo de 120/80. Contrlese la presin arterial a la [employer]. Evite la cafena, el alcohol, fumar y hacer actividad fsica durante los 30 minutos anteriores a controlarse la presin arterial. Estos factores pueden afectar la precisin de la lectura de la presin arterial. Esta informacin no tiene Marine scientist el consejo del mdico. Asegrese de hacerle al mdico cualquier pregunta que tenga. Document Revised: 03/22/2021 Document Reviewed: 03/22/2021 Elsevier Patient Education  [place].

## 2021-12-22 NOTE — Progress Notes (Unsigned)
   Established Patient Office Visit  Subjective   Patient ID: Kimberlin Scheel, female    DOB: 05/02/1991  Age: 31 y.o. MRN: [id number]  No chief complaint on file.   States that she needs amlopdipine '5mg'$  Has been taking for many years Has been out of it for the past since February, states that Has appt with sept Does not check bp at home often  June 6th  134/89 140   {History (Optional):23778}  ROS    Objective:     There were no vitals taken for this visit. {Vitals History (Optional):23777}  Physical Exam   No results found for any visits on 12/22/21.  {Labs (Optional):23779}  The ASCVD Risk score (Arnett DK, et al., 2019) failed to calculate for the following reasons:   The 2019 ASCVD risk score is only valid for ages 42 to 72    Assessment & Plan:   Problem List Items Addressed This Visit   None   No follow-ups on file.    Loraine Grip Mayers, PA-C

## 2021-12-23 ENCOUNTER — Other Ambulatory Visit: Payer: Self-pay

## 2021-12-23 ENCOUNTER — Encounter: Payer: Self-pay | Admitting: Nurse Practitioner

## 2021-12-23 ENCOUNTER — Encounter: Payer: Self-pay | Admitting: Physician Assistant

## 2021-12-23 ENCOUNTER — Ambulatory Visit: Payer: Self-pay | Attending: Nurse Practitioner

## 2021-12-23 DIAGNOSIS — R7989 Other specified abnormal findings of blood chemistry: Secondary | ICD-10-CM

## 2021-12-23 DIAGNOSIS — I10 Essential (primary) hypertension: Secondary | ICD-10-CM

## 2021-12-24 LAB — THYROID PANEL WITH TSH
Free Thyroxine Index: 1.7 (ref 1.2–4.9)
T3 Uptake Ratio: 28 % (ref 24–39)
T4, Total: 5.9 ug/dL (ref 4.5–12.0)
TSH: 0.678 u[IU]/mL (ref 0.450–4.500)

## 2021-12-24 LAB — COMP. METABOLIC PANEL (12)
AST: 14 IU/L (ref 0–40)
Albumin/Globulin Ratio: 1.8 (ref 1.2–2.2)
Albumin: 4.4 g/dL (ref 3.9–5.0)
Alkaline Phosphatase: 68 IU/L (ref 44–121)
BUN/Creatinine Ratio: 25 — ABNORMAL HIGH (ref 9–23)
BUN: 14 mg/dL (ref 6–20)
Bilirubin Total: 0.4 mg/dL (ref 0.0–1.2)
Calcium: 8.7 mg/dL (ref 8.7–10.2)
Chloride: 104 mmol/L (ref 96–106)
Creatinine, Ser: 0.55 mg/dL — ABNORMAL LOW (ref 0.57–1.00)
Globulin, Total: 2.4 g/dL (ref 1.5–4.5)
Glucose: 75 mg/dL (ref 70–99)
Potassium: 4 mmol/L (ref 3.5–5.2)
Sodium: 140 mmol/L (ref 134–144)
Total Protein: 6.8 g/dL (ref 6.0–8.5)
eGFR: 127 mL/min/{1.73_m2} (ref >59)

## 2021-12-29 NOTE — Progress Notes (Signed)
Error - dup chart

## 2022-02-01 ENCOUNTER — Other Ambulatory Visit: Payer: Self-pay

## 2022-02-08 ENCOUNTER — Other Ambulatory Visit: Payer: Self-pay

## 2022-02-22 ENCOUNTER — Other Ambulatory Visit: Payer: Self-pay

## 2022-03-21 ENCOUNTER — Encounter: Payer: Self-pay | Admitting: Nurse Practitioner

## 2022-03-21 ENCOUNTER — Other Ambulatory Visit: Payer: Self-pay

## 2022-03-21 ENCOUNTER — Ambulatory Visit: Payer: Self-pay | Attending: Nurse Practitioner | Admitting: Nurse Practitioner

## 2022-03-21 VITALS — BP 132/85 | HR 65 | Temp 98.0°F | Ht 59.5 in | Wt 146.0 lb

## 2022-03-21 DIAGNOSIS — R7989 Other specified abnormal findings of blood chemistry: Secondary | ICD-10-CM

## 2022-03-21 DIAGNOSIS — Z23 Encounter for immunization: Secondary | ICD-10-CM

## 2022-03-21 DIAGNOSIS — I10 Essential (primary) hypertension: Secondary | ICD-10-CM

## 2022-03-21 MED ORDER — AMLODIPINE BESYLATE 5 MG PO TABS
5.0000 mg | ORAL_TABLET | Freq: Every day | ORAL | 1 refills | Status: DC
  Filled 2022-03-21 – 2022-03-29 (×2): qty 90, 90d supply, fill #0
  Filled 2022-06-24 – 2022-08-17 (×2): qty 90, 90d supply, fill #1

## 2022-03-21 NOTE — Progress Notes (Addendum)
Assessment & Plan:  Jill Sexton was seen today for hypertension.  Diagnoses and all orders for this visit:  Primary hypertension -     amLODipine (NORVASC) 5 MG tablet; Take 1 tablet (5 mg total) by mouth daily. Continue amlodipine as prescribed.  Reminded to bring in blood pressure log for follow  up appointment.  RECOMMENDATIONS: DASH/Mediterranean Diets are healthier choices for HTN.    Abnormal TSH -     Thyroid Panel With TSH    Patient has been counseled on age-appropriate routine health concerns for screening and prevention. These are reviewed and up-to-date. Referrals have been placed accordingly. Immunizations are up-to-date or declined.    Subjective:   Chief Complaint  Patient presents with   Hypertension   HPI Jill Sexton 31 y.o. female presents to office today for follow up to HTN  VRI was used to communicate directly with patient for the entire encounter including providing detailed patient instructions.    HTN Currently taking amlodipine 5 mg daily. She does monitor her blood pressure at home. She has a reading on her phone from home showing 124/71.  BP Readings from Last 3 Encounters:  03/21/22 132/85  12/22/21 134/89  06/29/21 (!) 136/92     Review of Systems  Constitutional:  Negative for fever, malaise/fatigue and weight loss.  HENT: Negative.  Negative for nosebleeds.   Eyes: Negative.  Negative for blurred vision, double vision and photophobia.  Respiratory: Negative.  Negative for cough and shortness of breath.   Cardiovascular: Negative.  Negative for chest pain, palpitations and leg swelling.  Gastrointestinal: Negative.  Negative for heartburn, nausea and vomiting.  Musculoskeletal: Negative.  Negative for myalgias.  Neurological: Negative.  Negative for dizziness, focal weakness, seizures and headaches.  Psychiatric/Behavioral: Negative.  Negative for suicidal ideas.     Past Medical History:  Diagnosis Date   Chronic hypertension    on  amlodipine   Missed abortion     Past Surgical History:  Procedure Laterality Date   DILATION AND EVACUATION N/A 10/30/2018   Procedure: DILATATION AND EVACUATION;  Surgeon: Woodroe Mode, MD;  Location: [facility];  Service: Gynecology;  Laterality: N/A;   NO PAST SURGERIES      Family History  Problem Relation Age of Onset   Hypertension Mother     Social History Reviewed with no changes to be made today.   Outpatient Medications Prior to Visit  Medication Sig Dispense Refill   amLODipine (NORVASC) 5 MG tablet Take 1 tablet (5 mg total) by mouth daily. 30 tablet 2   Ascorbic Acid (VITAMIN C) 100 MG tablet Take 100 mg by mouth daily. (Patient not taking: Reported on 03/21/2022)     ibuprofen (ADVIL) 600 MG tablet Take 1 tablet (600 mg total) by mouth every 8 (eight) hours as needed for mild pain. (Patient not taking: Reported on 03/21/2022) 30 tablet 0   Prenat-Fe Poly-Methfol-FA-DHA (VITAFOL ULTRA) 29-0.6-0.4-200 MG CAPS Take 1 tablet by mouth daily. (Patient not taking: Reported on 03/21/2022) 30 capsule 12   Prenatal Vit-Fe Fumarate-FA (PRENATAL VITAMINS PO) Take 1 tablet by mouth daily. (Patient not taking: Reported on 03/21/2022)     No facility-administered medications prior to visit.    No Known Allergies     Objective:    BP 132/85   Pulse 65   Temp 98 F (36.7 C) (Oral)   Ht 4' 11.5" (1.511 m)   Wt 146 lb (66.2 kg)   LMP 03/21/2022 (Exact Date) Comment: Started today.  SpO2 99%   BMI 28.99 kg/m  Wt Readings from Last 3 Encounters:  03/21/22 146 lb (66.2 kg)  12/22/21 140 lb (63.5 kg)  03/16/21 155 lb 8 oz (70.5 kg)    Physical Exam Vitals and nursing note reviewed.  Constitutional:      Appearance: She is well-developed.  HENT:     Head: Normocephalic and atraumatic.  Cardiovascular:     Rate and Rhythm: Normal rate and regular rhythm.     Heart sounds: Normal heart sounds. No murmur heard.    No friction rub. No gallop.  Pulmonary:      Effort: Pulmonary effort is normal. No tachypnea or respiratory distress.     Breath sounds: Normal breath sounds. No decreased breath sounds, wheezing, rhonchi or rales.  Chest:     Chest wall: No tenderness.  Abdominal:     General: Bowel sounds are normal.     Palpations: Abdomen is soft.  Musculoskeletal:        General: Normal range of motion.     Cervical back: Normal range of motion.  Skin:    General: Skin is warm and dry.  Neurological:     Mental Status: She is alert and oriented to person, place, and time.     Coordination: Coordination normal.  Psychiatric:        Behavior: Behavior normal. Behavior is cooperative.        Thought Content: Thought content normal.        Judgment: Judgment normal.          Patient has been counseled extensively about nutrition and exercise as well as the importance of adherence with medications and regular follow-up. The patient was given clear instructions to go to ER or return to medical center if symptoms don't improve, worsen or new problems develop. The patient verbalized understanding.   Follow-up: Return for PAP SMEAR november or december.   Gildardo Pounds, FNP-BC [facility], [place]   03/21/2022, 4:13 PM

## 2022-03-22 LAB — THYROID PANEL WITH TSH
Free Thyroxine Index: 1.6 (ref 1.2–4.9)
T3 Uptake Ratio: 25 % (ref 24–39)
T4, Total: 6.2 ug/dL (ref 4.5–12.0)
TSH: 0.725 u[IU]/mL (ref 0.450–4.500)

## 2022-03-28 ENCOUNTER — Other Ambulatory Visit: Payer: Self-pay

## 2022-03-29 ENCOUNTER — Other Ambulatory Visit: Payer: Self-pay

## 2022-05-24 ENCOUNTER — Ambulatory Visit: Payer: Self-pay | Admitting: Nurse Practitioner

## 2022-05-24 NOTE — Progress Notes (Signed)
ON menstrual cycle. Needs to reschedule appt

## 2022-06-24 ENCOUNTER — Other Ambulatory Visit: Payer: Self-pay

## 2022-06-30 ENCOUNTER — Other Ambulatory Visit: Payer: Self-pay

## 2022-07-20 ENCOUNTER — Ambulatory Visit: Payer: Self-pay | Attending: Nurse Practitioner | Admitting: Nurse Practitioner

## 2022-07-25 ENCOUNTER — Ambulatory Visit: Payer: Self-pay | Admitting: Nurse Practitioner

## 2022-08-17 ENCOUNTER — Other Ambulatory Visit: Payer: Self-pay

## 2022-09-07 ENCOUNTER — Ambulatory Visit: Payer: Self-pay | Admitting: Nurse Practitioner

## 2022-11-28 ENCOUNTER — Other Ambulatory Visit: Payer: Self-pay | Admitting: Nurse Practitioner

## 2022-11-28 ENCOUNTER — Other Ambulatory Visit: Payer: Self-pay

## 2022-11-28 DIAGNOSIS — I10 Essential (primary) hypertension: Secondary | ICD-10-CM

## 2022-11-28 NOTE — Telephone Encounter (Signed)
Medication Refill - Medication: Rx #: [id number] amLODipine (NORVASC) 5 MG tablet [[id number]]   Has the patient contacted their pharmacy? Yes.   (Agent: If no, request that the patient contact the pharmacy for the refill. If patient does not wish to contact the pharmacy document the reason why and proceed with request.) (Agent: If yes, when and what did the pharmacy advise?)  Preferred Pharmacy (with phone number or street name): [facility] Phone: [phone]  Fax: [phone]    Has the patient been seen for an appointment in the last year OR does the patient have an upcoming appointment? Yes.  02/08/23  Agent: Please be advised that RX refills may take up to 3 business days. We ask that you follow-up with your pharmacy.

## 2022-11-29 ENCOUNTER — Other Ambulatory Visit: Payer: Self-pay

## 2022-11-29 MED ORDER — AMLODIPINE BESYLATE 5 MG PO TABS
5.0000 mg | ORAL_TABLET | Freq: Every day | ORAL | 1 refills | Status: DC
  Filled 2022-11-29: qty 30, 30d supply, fill #0

## 2022-11-29 NOTE — Telephone Encounter (Signed)
Requested Prescriptions  Pending Prescriptions Disp Refills   amLODipine (NORVASC) 5 MG tablet 30 tablet 1    Sig: Take 1 tablet (5 mg total) by mouth daily.     Cardiovascular: Calcium Channel Blockers 2 Failed - 11/28/2022  2:09 PM      Failed - Valid encounter within last 6 months    Recent Outpatient Visits           8 months ago Primary hypertension   [place] [facility] [place], Shea Stakes, NP   1 year ago Primary hypertension   [place] [facility] [place], Shea Stakes, NP   1 year ago Primary hypertension   [place] [facility] [place], Shea Stakes, NP   2 years ago Essential hypertension   [place] [facility] Hoy Register, MD       Future Appointments             In 1 month Claiborne Rigg, NP [facility]            Passed - Last BP in normal range    BP Readings from Last 1 Encounters:  03/21/22 132/85         Passed - Last Heart Rate in normal range    Pulse Readings from Last 1 Encounters:  03/21/22 65

## 2022-12-05 ENCOUNTER — Other Ambulatory Visit: Payer: Self-pay

## 2022-12-13 ENCOUNTER — Ambulatory Visit: Payer: Self-pay

## 2022-12-13 NOTE — Telephone Encounter (Signed)
  Chief Complaint: Depression Symptoms: Sadness, worthlessness Frequency: 1 month Pertinent Negatives: Patient denies Current thought of self harm Disposition: [] ED /[] Urgent Care (no appt availability in office) / [x] Appointment(In office/virtual)/ []  [place] [facility]/ [] Home Care/ [] Refused Recommended Disposition /[] [place] [facility]/ []  Follow-up with PCP Additional Notes: Pt states that during the last month she has become depressed. No changes in home life. Pt states that she has no plan to harm herself, but has considered it 2 times in the past month. Gave pt phone number for [facility] 24 hour hotline and Suicide prevention hotline.  PT will call back or seek emergency care if needed before tomorrow's appt.    Reason for Disposition  [1] Depression AND [2] worsening (e.g., sleeping poorly, less able to do activities of daily living)  Answer Assessment - Initial Assessment Questions 1. CONCERN: "What happened that made you call today?"     No, have felt this way for 1 month 2. DEPRESSION SYMPTOM SCREENING: "How are you feeling overall?" (e.g., decreased energy, increased sleeping or difficulty sleeping, difficulty concentrating, feelings of sadness, guilt, hopelessness, or worthlessness)     All of the above 3. RISK OF HARM - SUICIDAL IDEATION:  "Do you ever have thoughts of hurting or killing yourself?"  (e.g., yes, no, no but preoccupation with thoughts about death)   - INTENT:  "Do you have thoughts of hurting or killing yourself right NOW?" (e.g., yes, no, N/A)   - PLAN: "Do you have a specific plan for how you would do this?" (e.g., gun, knife, overdose, no plan, N/A)     no 4. RISK OF HARM - HOMICIDAL IDEATION:  "Do you ever have thoughts of hurting or killing someone else?"  (e.g., yes, no, no but preoccupation with thoughts about death)   - INTENT:  "Do you have thoughts of hurting or killing someone right NOW?" (e.g., yes, no, N/A)   - PLAN: "Do you have a specific plan  for how you would do this?" (e.g., gun, knife, no plan, N/A)      no 5. FUNCTIONAL IMPAIRMENT: "How have things been going for you overall? Have you had more difficulty than usual doing your normal daily activities?"  (e.g., better, same, worse; self-care, school, work, interactions)     yes 6. SUPPORT: "Who is with you now?" "Who do you live with?" "Do you have family or friends who you can talk to?"      Mom and spouse 8. STRESSORS: "Has there been any new stress or recent changes in your life?"     no 9. ALCOHOL USE OR SUBSTANCE USE (DRUG USE): "Do you drink alcohol or use any illegal drugs?"     no  11. PREGNANCY: "Is there any chance you are pregnant?" "When was your last menstrual period?"       no  Protocols used: Depression-A-AH

## 2022-12-14 ENCOUNTER — Encounter: Payer: Self-pay | Admitting: Nurse Practitioner

## 2022-12-14 ENCOUNTER — Ambulatory Visit: Payer: Self-pay | Attending: Nurse Practitioner | Admitting: Nurse Practitioner

## 2022-12-14 VITALS — BP 127/85 | HR 66 | Ht 59.5 in | Wt 142.8 lb

## 2022-12-14 DIAGNOSIS — F32A Depression, unspecified: Secondary | ICD-10-CM

## 2022-12-14 DIAGNOSIS — F419 Anxiety disorder, unspecified: Secondary | ICD-10-CM

## 2022-12-14 NOTE — Progress Notes (Signed)
[id number] Jill Sexton   Patient states that she has been feeling depressed for a month now.

## 2022-12-14 NOTE — Progress Notes (Signed)
Assessment & Plan:  Velmer was seen today for depression.  Diagnoses and all orders for this visit:  Anxiety and depression Declines SSRI -     Thyroid Panel With TSH -     Ambulatory referral to Behavioral Health    Patient has been counseled on age-appropriate routine health concerns for screening and prevention. These are reviewed and up-to-date. Referrals have been placed accordingly. Immunizations are up-to-date or declined.    Subjective:   Chief Complaint  Patient presents with   Depression   Depression        Associated symptoms include no myalgias, no headaches and no suicidal ideas.  Jill Sexton 32 y.o. female presents to office today for follow up to depression.   VRI was used to communicate directly with patient for the entire encounter including providing detailed patient instructions.     She endorses onset of depressive mood over the past month. Denies any physical, mental or financial stressors at this time. She does endorse ruminating thoughts of harming herself with no plan.     12/14/2022    9:33 AM 03/21/2022    4:06 PM 03/16/2021    4:19 PM  Depression screen PHQ 2/9  Decreased Interest 2 0 0  Down, Depressed, Hopeless 3 0 0  PHQ - 2 Score 5 0 0  Altered sleeping 2 0 0  Tired, decreased energy 2 0 0  Change in appetite 3 0 0  Feeling bad or failure about yourself  3 0 0  Trouble concentrating 0 0 0  Moving slowly or fidgety/restless 0 0 0  Suicidal thoughts 3 0 0  PHQ-9 Score 18 0 0  Difficult doing work/chores Not difficult at all  Not difficult at all      12/14/2022    9:33 AM 03/21/2022    4:06 PM 03/16/2021    4:19 PM 07/14/2020    2:39 PM  GAD 7 : Generalized Anxiety Score  Nervous, Anxious, on Edge 0 0 0 0  Control/stop worrying 0 0 0 0  Worry too much - different things 0 0 0 0  Trouble relaxing 0 0 0 0  Restless 0 0 0 0  Easily annoyed or irritable 0 0 0 0  Afraid - awful might happen 0 0 0 0  Total GAD 7 Score 0 0 0 0  Anxiety  Difficulty   Not difficult at all         Review of Systems  Constitutional:  Negative for fever, malaise/fatigue and weight loss.  HENT: Negative.  Negative for nosebleeds.   Eyes: Negative.  Negative for blurred vision, double vision and photophobia.  Respiratory: Negative.  Negative for cough and shortness of breath.   Cardiovascular: Negative.  Negative for chest pain, palpitations and leg swelling.  Gastrointestinal: Negative.  Negative for heartburn, nausea and vomiting.  Musculoskeletal: Negative.  Negative for myalgias.  Neurological: Negative.  Negative for dizziness, focal weakness, seizures and headaches.  Psychiatric/Behavioral:  Positive for depression. Negative for suicidal ideas. The patient is nervous/anxious.     Past Medical History:  Diagnosis Date   Chronic hypertension    on amlodipine   Missed abortion     Past Surgical History:  Procedure Laterality Date   DILATION AND EVACUATION N/A 10/30/2018   Procedure: DILATATION AND EVACUATION;  Surgeon: Adam Phenix, MD;  Location: [facility];  Service: Gynecology;  Laterality: N/A;   NO PAST SURGERIES      Family History  Problem Relation  Age of Onset   Hypertension Mother     Social History Reviewed with no changes to be made today.   Outpatient Medications Prior to Visit  Medication Sig Dispense Refill   amLODipine (NORVASC) 5 MG tablet Take 1 tablet (5 mg total) by mouth daily. 30 tablet 1   No facility-administered medications prior to visit.    No Known Allergies     Objective:    BP 127/85 (BP Location: Left Arm, Patient Position: Sitting, Cuff Size: Normal)   Pulse 66   Ht 4' 11.5" (1.511 m)   Wt 142 lb 12.8 oz (64.8 kg)   LMP 12/05/2022 (Exact Date)   SpO2 100%   BMI 28.36 kg/m  Wt Readings from Last 3 Encounters:  12/14/22 142 lb 12.8 oz (64.8 kg)  03/21/22 146 lb (66.2 kg)  12/22/21 140 lb (63.5 kg)    Physical Exam Vitals and nursing note reviewed.   Constitutional:      Appearance: She is well-developed.  HENT:     Head: Normocephalic and atraumatic.  Cardiovascular:     Rate and Rhythm: Normal rate and regular rhythm.     Heart sounds: Normal heart sounds. No murmur heard.    No friction rub. No gallop.  Pulmonary:     Effort: Pulmonary effort is normal. No tachypnea or respiratory distress.     Breath sounds: Normal breath sounds. No decreased breath sounds, wheezing, rhonchi or rales.  Chest:     Chest wall: No tenderness.  Abdominal:     General: Bowel sounds are normal.     Palpations: Abdomen is soft.  Musculoskeletal:        General: Normal range of motion.     Cervical back: Normal range of motion.  Skin:    General: Skin is warm and dry.  Neurological:     Mental Status: She is alert and oriented to person, place, and time.     Coordination: Coordination normal.  Psychiatric:        Mood and Affect: Mood is depressed. Affect is flat.        Behavior: Behavior normal. Behavior is cooperative.        Thought Content: Thought content normal.        Cognition and Memory: Cognition normal.        Judgment: Judgment normal.          Patient has been counseled extensively about nutrition and exercise as well as the importance of adherence with medications and regular follow-up. The patient was given clear instructions to go to ER or return to medical center if symptoms don't improve, worsen or new problems develop. The patient verbalized understanding.   Follow-up: Return if symptoms worsen or fail to improve.   Claiborne Rigg, FNP-BC [facility] [place], [place] [phone]   12/14/2022, 10:03 AM

## 2022-12-15 LAB — THYROID PANEL WITH TSH
Free Thyroxine Index: 1.9 (ref 1.2–4.9)
T3 Uptake Ratio: 28 % (ref 24–39)
T4, Total: 6.8 ug/dL (ref 4.5–12.0)
TSH: 0.763 u[IU]/mL (ref 0.450–4.500)

## 2023-01-09 ENCOUNTER — Other Ambulatory Visit: Payer: Self-pay

## 2023-01-09 ENCOUNTER — Encounter: Payer: Self-pay | Admitting: Nurse Practitioner

## 2023-01-09 ENCOUNTER — Ambulatory Visit: Payer: Self-pay | Attending: Nurse Practitioner | Admitting: Nurse Practitioner

## 2023-01-09 VITALS — BP 116/75 | HR 65 | Ht 59.5 in | Wt 149.4 lb

## 2023-01-09 DIAGNOSIS — I10 Essential (primary) hypertension: Secondary | ICD-10-CM

## 2023-01-09 DIAGNOSIS — Z862 Personal history of diseases of the blood and blood-forming organs and certain disorders involving the immune mechanism: Secondary | ICD-10-CM

## 2023-01-09 MED ORDER — AMLODIPINE BESYLATE 5 MG PO TABS
5.0000 mg | ORAL_TABLET | Freq: Every day | ORAL | 1 refills | Status: DC
  Filled 2023-01-09: qty 90, 90d supply, fill #0
  Filled 2023-05-22: qty 90, 90d supply, fill #1

## 2023-01-09 NOTE — Progress Notes (Signed)
Assessment & Plan:  Jill Sexton was seen today for hypertension.  Diagnoses and all orders for this visit:  Primary hypertension -     amLODipine (NORVASC) 5 MG tablet; Take 1 tablet (5 mg total) by mouth daily. Please fill as a 90 day supply -     CMP14+EGFR Continue blood sugar control as discussed in office today, low carbohydrate diet, and regular physical exercise as tolerated, 150 minutes per week (30 min each day, 5 days per week, or 50 min 3 days per week). Keep blood sugar logs with fasting goal of 90-130 mg/dl, post prandial (after you eat) less than 180.  For Hypoglycemia: BS <60 and Hyperglycemia BS >400; contact the clinic ASAP. Annual eye exams and foot exams are recommended.   History of anemia -     CBC with Differential    Patient has been counseled on age-appropriate routine health concerns for screening and prevention. These are reviewed and up-to-date. Referrals have been placed accordingly. Immunizations are up-to-date or declined.    Subjective:   Chief Complaint  Patient presents with   Hypertension   HPI Jill Sexton 32 y.o. female presents to office today for follow up to HTN    HTN Blood pressure is well controlled today. She is taking amlodipine as prescribed.  BP Readings from Last 3 Encounters:  01/09/23 116/75  12/14/22 127/85  03/21/22 132/85     Anxiety and Depression She has an upcoming appointment with behavioral health. Does not endorse any current thoughts of self harm.    Review of Systems  Constitutional:  Negative for fever, malaise/fatigue and weight loss.  HENT: Negative.  Negative for nosebleeds.   Eyes: Negative.  Negative for blurred vision, double vision and photophobia.  Respiratory: Negative.  Negative for cough and shortness of breath.   Cardiovascular: Negative.  Negative for chest pain, palpitations and leg swelling.  Gastrointestinal: Negative.  Negative for heartburn, nausea and vomiting.  Musculoskeletal: Negative.   Negative for myalgias.  Neurological: Negative.  Negative for dizziness, focal weakness, seizures and headaches.  Psychiatric/Behavioral: Negative.  Negative for suicidal ideas.     Past Medical History:  Diagnosis Date   Chronic hypertension    on amlodipine   Missed abortion     Past Surgical History:  Procedure Laterality Date   DILATION AND EVACUATION N/A 10/30/2018   Procedure: DILATATION AND EVACUATION;  Surgeon: Adam Phenix, MD;  Location: [facility];  Service: Gynecology;  Laterality: N/A;   NO PAST SURGERIES      Family History  Problem Relation Age of Onset   Hypertension Mother     Social History Reviewed with no changes to be made today.   Outpatient Medications Prior to Visit  Medication Sig Dispense Refill   amLODipine (NORVASC) 5 MG tablet Take 1 tablet (5 mg total) by mouth daily. 30 tablet 1   No facility-administered medications prior to visit.    No Known Allergies     Objective:    BP 116/75 (BP Location: Left Arm, Patient Position: Sitting, Cuff Size: Normal)   Pulse 65   Ht 4' 11.5" (1.511 m)   Wt 149 lb 6.4 oz (67.8 kg)   LMP 12/28/2022 (Exact Date)   SpO2 98%   BMI 29.67 kg/m  Wt Readings from Last 3 Encounters:  01/09/23 149 lb 6.4 oz (67.8 kg)  12/14/22 142 lb 12.8 oz (64.8 kg)  03/21/22 146 lb (66.2 kg)    Physical Exam Vitals and nursing note reviewed.  Constitutional:      Appearance: She is well-developed.  HENT:     Head: Normocephalic and atraumatic.  Cardiovascular:     Rate and Rhythm: Normal rate and regular rhythm.     Heart sounds: Normal heart sounds. No murmur heard.    No friction rub. No gallop.  Pulmonary:     Effort: Pulmonary effort is normal. No tachypnea or respiratory distress.     Breath sounds: Normal breath sounds. No decreased breath sounds, wheezing, rhonchi or rales.  Chest:     Chest wall: No tenderness.  Abdominal:     General: Bowel sounds are normal.     Palpations: Abdomen  is soft.  Musculoskeletal:        General: Normal range of motion.     Cervical back: Normal range of motion.  Skin:    General: Skin is warm and dry.  Neurological:     Mental Status: She is alert and oriented to person, place, and time.     Coordination: Coordination normal.  Psychiatric:        Behavior: Behavior normal. Behavior is cooperative.        Thought Content: Thought content normal.        Judgment: Judgment normal.          Patient has been counseled extensively about nutrition and exercise as well as the importance of adherence with medications and regular follow-up. The patient was given clear instructions to go to ER or return to medical center if symptoms don't improve, worsen or new problems develop. The patient verbalized understanding.   Follow-up: Return in about 6 months (around 07/12/2023).   Claiborne Rigg, FNP-BC [facility] [place], [place] [phone]   01/09/2023, 1:57 PM

## 2023-01-12 LAB — CBC WITH DIFFERENTIAL/PLATELET
Basophils Absolute: 0.1 10*3/uL (ref 0.0–0.2)
Basos: 1 %
EOS (ABSOLUTE): 0.1 10*3/uL (ref 0.0–0.4)
Eos: 2 %
Hematocrit: 35.4 % (ref 34.0–46.6)
Hemoglobin: 12.1 g/dL (ref 11.1–15.9)
Immature Grans (Abs): 0 10*3/uL (ref 0.0–0.1)
Immature Granulocytes: 0 %
Lymphocytes Absolute: 2 10*3/uL (ref 0.7–3.1)
Lymphs: 28 %
MCH: 30.1 pg (ref 26.6–33.0)
MCHC: 34.2 g/dL (ref 31.5–35.7)
MCV: 88 fL (ref 79–97)
Monocytes Absolute: 0.5 10*3/uL (ref 0.1–0.9)
Monocytes: 7 %
Neutrophils Absolute: 4.5 10*3/uL (ref 1.4–7.0)
Neutrophils: 62 %
Platelets: 247 10*3/uL (ref 150–450)
RBC: 4.02 x10E6/uL (ref 3.77–5.28)
RDW: 12.8 % (ref 11.7–15.4)
WBC: 7.2 10*3/uL (ref 3.4–10.8)

## 2023-01-12 LAB — CMP14+EGFR
ALT: 15 IU/L (ref 0–32)
AST: 13 IU/L (ref 0–40)
Albumin: 4.1 g/dL (ref 4.0–5.0)
Alkaline Phosphatase: 73 IU/L (ref 44–121)
BUN/Creatinine Ratio: 21 (ref 9–23)
BUN: 18 mg/dL (ref 6–20)
Bilirubin Total: <0.2 mg/dL (ref 0.0–1.2)
CO2: 22 mmol/L (ref 20–29)
Calcium: 9 mg/dL (ref 8.7–10.2)
Chloride: 104 mmol/L (ref 96–106)
Creatinine, Ser: 0.87 mg/dL (ref 0.57–1.00)
Globulin, Total: 2.3 g/dL (ref 1.5–4.5)
Glucose: 78 mg/dL (ref 70–99)
Potassium: 4.4 mmol/L (ref 3.5–5.2)
Sodium: 140 mmol/L (ref 134–144)
Total Protein: 6.4 g/dL (ref 6.0–8.5)
eGFR: 92 mL/min/{1.73_m2} (ref >59)

## 2023-02-08 ENCOUNTER — Ambulatory Visit: Payer: Self-pay | Admitting: Nurse Practitioner

## 2023-05-22 ENCOUNTER — Other Ambulatory Visit: Payer: Self-pay

## 2023-09-15 ENCOUNTER — Other Ambulatory Visit: Payer: Self-pay | Admitting: Nurse Practitioner

## 2023-09-15 ENCOUNTER — Telehealth: Payer: Self-pay

## 2023-09-15 ENCOUNTER — Other Ambulatory Visit: Payer: Self-pay

## 2023-09-15 DIAGNOSIS — I10 Essential (primary) hypertension: Secondary | ICD-10-CM

## 2023-09-15 NOTE — Telephone Encounter (Signed)
 Copied from CRM [id number]. Topic: Clinical - Medication Refill >> Sep 15, 2023 10:00 AM Gery Pray wrote: Most Recent Primary Care Visit:  Provider: Bertram Denver W  Department: [facility]  Visit Type: OFFICE VISIT  Date: 01/09/2023  Medication: amLODipine (NORVASC) 5 MG tablet  Has the patient contacted their pharmacy? No (Agent: If no, request that the patient contact the pharmacy for the refill. If patient does not wish to contact the pharmacy document the reason why and proceed with request.) (Agent: If yes, when and what did the pharmacy advise?)  Is this the correct pharmacy for this prescription? Yes If no, delete pharmacy and type the correct one.  This is the patient's preferred pharmacy:  [facility] [address], [address] [place] [address] Phone: [phone] Fax: [phone]   Has the prescription been filled recently? No  Is the patient out of the medication? No 3 pills remaining  Has the patient been seen for an appointment in the last year OR does the patient have an upcoming appointment? Yes  Can we respond through MyChart? Yes  Agent: Please be advised that Rx refills may take up to 3 business days. We ask that you follow-up with your pharmacy.

## 2023-09-18 ENCOUNTER — Other Ambulatory Visit: Payer: Self-pay | Admitting: Nurse Practitioner

## 2023-09-18 ENCOUNTER — Other Ambulatory Visit: Payer: Self-pay

## 2023-09-18 DIAGNOSIS — I10 Essential (primary) hypertension: Secondary | ICD-10-CM

## 2023-09-18 MED ORDER — AMLODIPINE BESYLATE 5 MG PO TABS
5.0000 mg | ORAL_TABLET | Freq: Every day | ORAL | 1 refills | Status: AC
  Filled 2023-09-18: qty 90, 90d supply, fill #0
  Filled 2024-01-09: qty 90, 90d supply, fill #1

## 2023-09-18 NOTE — Telephone Encounter (Signed)
 Unable to reach patient by phone

## 2023-09-18 NOTE — Telephone Encounter (Signed)
Patient identified by name and date of birth.  Patient aware of response.

## 2023-09-18 NOTE — Telephone Encounter (Signed)
 refilled

## 2023-09-27 ENCOUNTER — Other Ambulatory Visit: Payer: Self-pay

## 2023-10-03 ENCOUNTER — Encounter: Payer: Self-pay | Admitting: Nurse Practitioner

## 2023-10-03 ENCOUNTER — Other Ambulatory Visit: Payer: Self-pay

## 2023-10-03 ENCOUNTER — Ambulatory Visit: Payer: Self-pay | Attending: Nurse Practitioner | Admitting: Nurse Practitioner

## 2023-10-03 VITALS — BP 134/89 | HR 67 | Resp 20 | Ht 59.5 in | Wt 163.4 lb

## 2023-10-03 DIAGNOSIS — L918 Other hypertrophic disorders of the skin: Secondary | ICD-10-CM

## 2023-10-03 DIAGNOSIS — F32A Anxiety disorder, unspecified: Secondary | ICD-10-CM

## 2023-10-03 DIAGNOSIS — I10 Essential (primary) hypertension: Secondary | ICD-10-CM

## 2023-10-03 DIAGNOSIS — F418 Other specified anxiety disorders: Secondary | ICD-10-CM

## 2023-10-03 MED ORDER — BUPROPION HCL ER (XL) 150 MG PO TB24
150.0000 mg | ORAL_TABLET | Freq: Every day | ORAL | 1 refills | Status: AC
  Filled 2023-10-03: qty 30, 30d supply, fill #0

## 2023-10-03 NOTE — Progress Notes (Signed)
 Assessment & Plan:  [name] was seen today for medical management of chronic issues.  Diagnoses and all orders for this visit:  Primary hypertension -     CMP14+EGFR Continue all antihypertensives as prescribed.  Reminded to bring in blood pressure log for follow  up appointment.  RECOMMENDATIONS: DASH/Mediterranean Diets are healthier choices for HTN.    Anxiety and depression -     buPROPion (WELLBUTRIN XL) 150 MG 24 hr tablet; Take 1 tablet (150 mg total) by mouth daily.  Skin tag -     Ambulatory referral to Podiatry    Patient has been counseled on age-appropriate routine health concerns for screening and prevention. These are reviewed and up-to-date. Referrals have been placed accordingly. Immunizations are up-to-date or declined.    Subjective:   Chief Complaint  Patient presents with   Medical Management of Chronic Issues    Jill Sexton 33 y.o. female presents to office today for HTN  VRI was used to communicate directly with patient for the entire encounter including providing detailed patient instructions.     PMH: HTN, Anxiety and Depression   HTN Currently taking amlodipine 10 mg daily as prescribed. Blood pressure is well controlled.  BP Readings from Last 3 Encounters:  10/03/23 134/89  01/09/23 116/75  12/14/22 127/85      Anxiety and Depression  She has gained weight due to anxiety and stress. She just recently started seeing a behavioral health therapist. Weight is up over 30lbs due to stress eating. She would like to start a medication to help reduce stress and anxiety.     10/03/2023    9:25 AM 01/09/2023    1:35 PM 12/14/2022    9:33 AM  Depression screen PHQ 2/9  Decreased Interest 2 1 2   Down, Depressed, Hopeless 0 1 3  PHQ - 2 Score 2 2 5   Altered sleeping 0 1 2  Tired, decreased energy 2 1 2   Change in appetite 3 2 3   Feeling bad or failure about yourself  2 0 3  Trouble concentrating 1 0 0  Moving slowly or fidgety/restless 0 0 0   Suicidal thoughts 0 0 3  PHQ-9 Score 10 6 18   Difficult doing work/chores Not difficult at all  Not difficult at all       10/03/2023    9:26 AM 01/09/2023    1:36 PM 12/14/2022    9:33 AM 03/21/2022    4:06 PM  GAD 7 : Generalized Anxiety Score  Nervous, Anxious, on Edge 2 1 0 0  Control/stop worrying 0 0 0 0  Worry too much - different things 1 1 0 0  Trouble relaxing 1 0 0 0  Restless 0 0 0 0  Easily annoyed or irritable 0 0 0 0  Afraid - awful might happen 2 1 0 0  Total GAD 7 Score 6 3 0 0  Anxiety Difficulty Not difficult at all      She has a small skin tag on the left medial foot. There is associated pain when it rubs against her shoes and socks. She also has pre callous area on on both 5th toes. Recommended callous protector pads OTC at this time.   Review of Systems  Constitutional:  Negative for fever, malaise/fatigue and weight loss.  HENT: Negative.  Negative for nosebleeds.   Eyes: Negative.  Negative for blurred vision, double vision and photophobia.  Respiratory: Negative.  Negative for cough and shortness of breath.   Cardiovascular: Negative.  Negative for chest pain, palpitations and leg swelling.  Gastrointestinal: Negative.  Negative for heartburn, nausea and vomiting.  Musculoskeletal: Negative.  Negative for myalgias.  Skin:        SEE HPI  Neurological: Negative.  Negative for dizziness, focal weakness, seizures and headaches.  Psychiatric/Behavioral:  Positive for depression. Negative for suicidal ideas. The patient is nervous/anxious.     Past Medical History:  Diagnosis Date   Chronic hypertension    on amlodipine   Missed abortion     Past Surgical History:  Procedure Laterality Date   DILATION AND EVACUATION N/A 10/30/2018   Procedure: DILATATION AND EVACUATION;  Surgeon: Adam Phenix, MD;  Location: [facility];  Service: Gynecology;  Laterality: N/A;   NO PAST SURGERIES      Family History  Problem Relation Age of Onset    Hypertension Mother     Social History Reviewed with no changes to be made today.   Outpatient Medications Prior to Visit  Medication Sig Dispense Refill   amLODipine (NORVASC) 5 MG tablet Take 1 tablet (5 mg total) by mouth daily. Please fill as a 90 day supply 90 tablet 1   No facility-administered medications prior to visit.    No Known Allergies     Objective:    BP 134/89 (BP Location: Left Arm, Patient Position: Sitting, Cuff Size: Normal)   Pulse 67   Resp 20   Ht 4' 11.5" (1.511 m)   Wt 163 lb 6.4 oz (74.1 kg)   LMP 10/02/2023 (Exact Date)   SpO2 100%   BMI 32.45 kg/m  Wt Readings from Last 3 Encounters:  10/03/23 163 lb 6.4 oz (74.1 kg)  01/09/23 149 lb 6.4 oz (67.8 kg)  12/14/22 142 lb 12.8 oz (64.8 kg)    Physical Exam Vitals and nursing note reviewed.  Constitutional:      Appearance: She is well-developed.  HENT:     Head: Normocephalic and atraumatic.  Cardiovascular:     Rate and Rhythm: Normal rate and regular rhythm.     Heart sounds: Normal heart sounds. No murmur heard.    No friction rub. No gallop.  Pulmonary:     Effort: Pulmonary effort is normal. No tachypnea or respiratory distress.     Breath sounds: Normal breath sounds. No decreased breath sounds, wheezing, rhonchi or rales.  Chest:     Chest wall: No tenderness.  Abdominal:     General: Bowel sounds are normal.     Palpations: Abdomen is soft.  Musculoskeletal:        General: Normal range of motion.     Cervical back: Normal range of motion.       Feet:  Feet:     Right foot:     Skin integrity: Callus present.     Left foot:     Skin integrity: Callus present.  Skin:    General: Skin is warm and dry.  Neurological:     Mental Status: She is alert and oriented to person, place, and time.     Coordination: Coordination normal.  Psychiatric:        Behavior: Behavior normal. Behavior is cooperative.        Thought Content: Thought content normal.        Judgment: Judgment  normal.          Patient has been counseled extensively about nutrition and exercise as well as the importance of adherence with medications and regular follow-up. The patient  was given clear instructions to go to ER or return to medical center if symptoms don't improve, worsen or new problems develop. The patient verbalized understanding.   Follow-up: Return in about 4 weeks (around 10/31/2023) for anxiety and depression .   Claiborne Rigg, FNP-BC [facility] [place], [place] [phone]   10/03/2023, 11:01 AM

## 2023-10-04 ENCOUNTER — Encounter: Payer: Self-pay | Admitting: Nurse Practitioner

## 2023-10-04 LAB — CMP14+EGFR
ALT: 23 IU/L (ref 0–32)
AST: 13 IU/L (ref 0–40)
Albumin: 4.4 g/dL (ref 4.0–5.0)
Alkaline Phosphatase: 78 IU/L (ref 44–121)
BUN/Creatinine Ratio: 26 — ABNORMAL HIGH (ref 9–23)
BUN: 14 mg/dL (ref 6–20)
Bilirubin Total: 0.3 mg/dL (ref 0.0–1.2)
CO2: 20 mmol/L (ref 20–29)
Calcium: 9.2 mg/dL (ref 8.7–10.2)
Chloride: 100 mmol/L (ref 96–106)
Creatinine, Ser: 0.54 mg/dL — ABNORMAL LOW (ref 0.57–1.00)
Globulin, Total: 2.7 g/dL (ref 1.5–4.5)
Glucose: 92 mg/dL (ref 70–99)
Potassium: 4.3 mmol/L (ref 3.5–5.2)
Sodium: 141 mmol/L (ref 134–144)
Total Protein: 7.1 g/dL (ref 6.0–8.5)
eGFR: 127 mL/min/{1.73_m2} (ref >59)

## 2023-10-12 ENCOUNTER — Ambulatory Visit (INDEPENDENT_AMBULATORY_CARE_PROVIDER_SITE_OTHER): Payer: Self-pay | Admitting: Podiatry

## 2023-10-12 ENCOUNTER — Encounter: Payer: Self-pay | Admitting: Podiatry

## 2023-10-12 VITALS — Ht 59.5 in | Wt 163.4 lb

## 2023-10-12 DIAGNOSIS — M2041 Other hammer toe(s) (acquired), right foot: Secondary | ICD-10-CM

## 2023-10-12 DIAGNOSIS — M21611 Bunion of right foot: Secondary | ICD-10-CM

## 2023-10-12 DIAGNOSIS — M2011 Hallux valgus (acquired), right foot: Secondary | ICD-10-CM

## 2023-10-12 DIAGNOSIS — D1801 Hemangioma of skin and subcutaneous tissue: Secondary | ICD-10-CM

## 2023-10-12 DIAGNOSIS — M2042 Other hammer toe(s) (acquired), left foot: Secondary | ICD-10-CM

## 2023-10-12 DIAGNOSIS — M2012 Hallux valgus (acquired), left foot: Secondary | ICD-10-CM

## 2023-10-12 DIAGNOSIS — L84 Corns and callosities: Secondary | ICD-10-CM

## 2023-10-12 DIAGNOSIS — M21612 Bunion of left foot: Secondary | ICD-10-CM

## 2023-10-12 NOTE — Patient Instructions (Signed)
 VISIT SUMMARY:  Today, we addressed the painful wart on your foot and the calluses on the sides of your feet. We also discussed your bunion and hammer toe deformities, which are contributing to the calluses and discomfort. Additionally, we removed a painful lesion on your left foot for further examination.  YOUR PLAN:  -BUNION AND HAMMER TOE DEFORMITIES: These are deformities of the toes that cause them to bend or misalign, leading to discomfort and calluses. We recommend wearing shoes with a wider width and a large toe box made of soft material to reduce pressure. Use corn pads or silicone pads to alleviate pressure on the calluses. Apply 40% urea cream to soften the calluses. We encourage you to try these non-surgical options first before considering surgery.  -BENIGN SKIN LESION: A benign skin lesion is a non-cancerous growth on the skin. We removed the painful lesion on your left foot using a 4 mm punch biopsy. The area was numbed with a local anesthetic, prepared with Betadine, and dressed with a Steri-Strip and bandage. Please remove the bandage tomorrow and let the Steri-Strip fall off naturally. You can shower after removing the bandage. We will call you with the pathology results. I suspect yours is something called a hemangioma  INSTRUCTIONS:  Please follow the recommendations for shoe modifications and padding to manage your bunion and hammer toe deformities. Use the prescribed 40% urea cream for your calluses. Remove the bandage from the excision site tomorrow and allow the Steri-Strip to fall off naturally. We will contact you with the pathology results of the removed lesion.

## 2023-10-12 NOTE — Progress Notes (Signed)
 Subjective:  Patient ID: Jill Sexton, female    DOB: Oct 30, 1990,  MRN: [id number]  Chief Complaint  Patient presents with   Plantar Warts    Pt is here due to skin tag/wart on her left foot, pt states it has been there for 3 years, hurts when shoe rubs against it.    Discussed the use of AI scribe software for clinical note transcription with the patient, who gave verbal consent to proceed.  An interpreter from [place] is present as well  History of Present Illness Jill Sexton is a 33 year old female who presents with a painful wart on her foot and calluses on the sides of her feet.  She has had a painful wart on her foot for approximately two years, causing significant pain and occasional swelling, especially when it rubs against her shoes. No prior treatments have been mentioned for this condition.  Over the past year, she has developed calluses on the sides of her feet. She manages these by trimming them with a razor, but they become large, painful, and seem to be expanding towards the front of her feet.  Additionally, she has calluses near her toenails, which she attributes to a bunion deformity and hammer toe deformities. These conditions cause discomfort and impact her daily activities.      Objective:    Physical Exam VASCULAR: DP and PT pulses palpable. Foot is warm and well-perfused. Capillary fill time is brisk. DERMATOLOGIC: Normal skin turgor, texture, and temperature. No open lesions, rashes, or ulcerations. Callus on the fifth toe laterally and on the medial hallux. 4 mm benign appearing skin tag vs hemangioma on left medial arch. NEUROLOGIC: Normal sensation to light touch and pressure. No paresthesias on examination. ORTHOPEDIC: Smooth pain-free range of motion of all examined joints. No ecchymosis or bruising. No gross deformity. No pain to palpation. Hallux valgus deformity bilaterally with reducible lesser hammer toe contractures. Adductovarus contracture of  the fifth toe.       Results Procedure: Removal of benign skin tag Description: The left medial arch was anesthetized with 1 cc of 0.5% Marcaine plain mixed with 2 ccs of 1% lidocaine with epinephrine. The area was prepared with Betadine. A four-millimeter punch biopsy was used to excise the lesion. It was sent in formalin to pathology. The site was addressed with a Steri-Strip and a pressure bandage. Informed Consent: Following consideration and discussion of the risks, benefits, and alternatives.  PATHOLOGY Biopsy: Sent in formalin to pathology (10/12/2023)   Assessment:   1. Hemangioma of skin   2. Hallux valgus with bunions, left   3. Hallux valgus with bunions, right   4. Hammertoe of left foot   5. Hammertoe of right foot   6. Callus of foot      Plan:  Patient was evaluated and treated and all questions answered.  Assessment and Plan Assessment & Plan Bunion and hammer toe deformities Hallux valgus deformity bilaterally with lesser hammer toe contractures and an adductive varus contracture of the fifth toe, causing calluses on the fifth toe laterally and on the medial hallux. Significant discomfort reported. Non-surgical options include shoe modifications and padding. Surgical options involve bone cutting, shifting, and realignment with potential use of screws or plates. She prefers to try non-surgical options first. - Advise wearing shoes with a wider width and a large toe box made of soft material to reduce pressure on the bunion and calluses. - Recommend the use of corn pads or silicone pads to alleviate pressure on the calluses. -  Prescribe 40% urea cream to soften the calluses, available for purchase on [employer]. - Encourage trying non-surgical options first, including shoe modifications and padding, before considering surgery.  Benign skin lesion 4 mm lesion on the left medial arch, present for approximately two years. Differential diagnosis includes a benign skin tag  or a hemangioma. The lesion is painful and occasionally swollen, likely due to its location and friction with footwear. She opted for removal today. - Perform excision of the lesion using a 4 mm punch biopsy. - Send the excised lesion for pathological examination to confirm diagnosis. - Instruct her to remove the bandage the following day and allow the Steri-Strip to fall off naturally. - Inform her that she can shower after bandage removal. - Call/MyChart message her with pathology results.      Return if symptoms worsen or fail to improve.

## 2023-10-31 ENCOUNTER — Ambulatory Visit: Payer: Self-pay | Admitting: Nurse Practitioner

## 2024-01-09 ENCOUNTER — Other Ambulatory Visit: Payer: Self-pay
